# Patient Record
Sex: Female | Born: 2000 | Race: White | Hispanic: No | Marital: Married | Smoking: Former smoker
Health system: Southern US, Community
[De-identification: ages and names within clinical notes are randomized; demographics above are authoritative.]

## PROBLEM LIST (undated history)

## (undated) DIAGNOSIS — F419 Anxiety disorder, unspecified: Secondary | ICD-10-CM

## (undated) HISTORY — PX: WISDOM TOOTH EXTRACTION: SHX21

## (undated) NOTE — *Deleted (*Deleted)
NAMELaparis Lynn, MRN:  [id number], DOB:  06-09-2001, LOS: 1 ADMISSION DATE:  10/10/2020, CONSULTATION DATE:  10/07/20 REFERRING MD:  Karren Burly, MD CHIEF COMPLAINT:  Belly pain   Brief History   48 year old who presented to the ED with severe abdominal pain found to have acute idiopathic pancreatitis.  History of present illness   Patient had onset of belly pain about 1 to 2 days ago.  Is associated with nausea with vomiting.  She points to the top of her belly.  Does endorse some radiation to the back.  No shortness of breath.  Has developed a little bit of chest pain over the last few hours.  No fever or chills.  She drinks irregularly, had 3 white claws last week.  No significant binge drinking recently.  She does not do IV drugs.  Does smoke marijuana on occasion.  No obvious medication culprits for pancreatitis on her medication list.  In the ED, she has been intermittently tachycardic.  She is demonstrated saturations in the mid to high 90s on room air.  Her blood pressure has been okay.  Her hemoglobin and hematocrit have increased despite aggressive hydration.  Leukocytosis climbed 20,000-40,000.  Lipase 1300.  No hyperbilirubinemia.  LFTs mildly elevated.  Noted to have some fatty liver on imaging.  Triglycerides only 181.  She endorses good urine output with the immense amount of IV fluid she is receiving.  Pertinent family history of alcohol abuse and depression from mother.   Past Medical History  Anxiety PTSD Bipolar type 2 with depression  Significant Hospital Events   10/21 admit, change to ICU status, start pressors 10/22 Intubated, start ARDS protocol  Consults:  GI Nephrology  Procedures:  ETT 10/22 >> Lt IJ CVL 10/22 >> Arterial line 10/22 >>   Significant Diagnostic Tests:   CT abd/pelvis 10/21 >> trace effusions, small ascites, fatty liver, diffuse inflammatory changes of pancreas with peripancreatic fluid and edema  Abd u/s 10/21 >> no  gallstones, CBD 5 mm  Micro Data:  COVID 10/21 >> negative Flu 10/21 >> negative MRSA by PCR 10/21 >> negative HIV screen >> non reactive Blood 10/21 >> no growth  Antimicrobials:  Meropenem 10/21 >>   Interim history/subjective:   Pt on versed, fentanyl  Patient currently on CRRT 10/22 tmax 102/  WBC 32.6 Vent - 60%, PEEP 10, RR 30 Glucose range 224-292 I/O 20 UOP; 6,043.4 + in last 24 hours   Objective   Blood pressure (!) 87/46, pulse (!) 110, temperature 99.3 F (37.4 C), resp. rate (!) 28, height 5' 4.02" (1.626 m), weight 83.9 kg, last menstrual period 09/25/2020, SpO2 100 %.    Vent Mode: PRVC FiO2 (%):  [80 %-100 %] 80 % Set Rate:  [28 bmp] 28 bmp Vt Set:  [380 mL] 380 mL PEEP:  [10 cmH20] 10 cmH20 Plateau Pressure:  [25 cmH20-32 cmH20] 32 cmH20   Intake/Output Summary (Last 24 hours) at 10/07/2020 0819 Last data filed at 10/07/2020 0800 Gross per 24 hour  Intake 5336.68 ml  Output 467 ml  Net 4869.68 ml   Filed Weights   10/06/20 0040 10/07/20 0100  Weight: 83.9 kg 83.9 kg    Examination:  General: pale, adult female lying in bed on vent in NAD HEENT: MM pink/moist, pupils reactive, no scleral icterus Neuro: sedate, no follow commands, weak cough/gag, moved head some when suctioned CV: s1s2, tachy, no m/r/g appreciated PULM: non-labored on vent, lungs bilaterally clear/diminished GI: firm and distended, absent  BS Extremities: cool/dry, no edema, mild cyanosis of the nail bed and slow cap refill Skin: no ecchymosis      Resolved Hospital Problem list     Assessment & Plan:   Severe acute pancreatitis. - AST 2,382 and ALT 922: possible shock liver; consider pancreatitis possibly due to alcohol abuse -Idiopathic more likely since IgG4 came back normal autoimmune less likely -TG this morning 272: goal keep TG <500; no tx necessary at this time -Lipase increased to 5,525 -Leukocytosis-prophylactic Meropenem started 10/21; cultures negative - GI  consulted >> no indication for procedures at this time - trickle tube feeds with vital high protein  SIRS/sepsis with lactic acidosis from acute pancreatitis. - continue volume resuscitation - titrate levo and neo to keep MAP > 65 - f/u lactic acid - monitor CVP  Acute hypoxic respiratory failure in setting of pancreatitis. - patient vented: ARDS protocol >> start with 7 cc/kg for Vt  - ?Consider increasing RR from 28 to 35 to help compensate for met acidosis - goal SpO2 90 to 95% - f/u CXR, ABG as needed  AKI from ATN. Hyperkalemia. Anion gap metabolic acidosis. - baseline creatinine 0.75 from 10-21-2020 - nephrology consulted 10/06/2020 - will likely need to start CRRT  Anemia of critical illness. - f/u CBC  Hyperkalemia Hypocalcemia Hyperglycemia Hypoalbuminemia Hypomagnesemia -Calcium Gluconate started 10/22 -SSI -consider albumin replacement if level <2g/dL -Consider magnesium replacement if symptomatic -Consider Kayexalate -Recheck CMP later today  Sedation while on ventilator. Hx of anxiety. - versed, fentanyl gtt for RASS goal -2 to -3 - hold outpt klonopin, seroquel, prozac  Best practice:  Diet: trickle tube feeds with vital high protein DVT prophylaxis: heparin SUP: protonix Mobility: bed rest Family: called and updated husband Disposition: ICU  Labs    CMP Latest Ref Rng & Units 10/07/2020 10/07/2020 10/07/2020  Glucose 70 - 99 mg/dL - 454(U) 981(X)  BUN 6 - 20 mg/dL - 91(Y) 78(G)  Creatinine 0.44 - 1.00 mg/dL - 9.56(O) 1.30(Q)  Sodium 135 - 145 mmol/L - 135 140  Potassium 3.5 - 5.1 mmol/L 7.2(HH) 7.2(HH) 7.2(HH)  Chloride 98 - 111 mmol/L - 108 109  CO2 22 - 32 mmol/L - 16(L) 10(L)  Calcium 8.9 - 10.3 mg/dL - 5.8(LL) 6.1(LL)  Total Protein 6.5 - 8.1 g/dL - 5.0(L) -  Total Bilirubin 0.3 - 1.2 mg/dL - 1.3(H) -  Alkaline Phos 38 - 126 U/L - 29(L) -  AST 15 - 41 U/L - 2,382(H) -  ALT 0 - 44 U/L - 922(H) -    CBC Latest Ref Rng & Units  10/07/2020 10/07/2020 10/07/2020  WBC 4.0 - 10.5 K/uL 32.6(H) 40.5(H) -  Hemoglobin 12.0 - 15.0 g/dL 6.5(H) 10.1(L) 10.2(L)  Hematocrit 36 - 46 % 30.4(L) 34.7(L) 30.0(L)  Platelets 150 - 400 K/uL 198 296 -    ABG    Component Value Date/Time   PHART 7.159 (LL) 10/07/2020 0625   PCO2ART 46.2 10/07/2020 0625   PO2ART 124 (H) 10/07/2020 0625   HCO3 15.4 (L) 10/07/2020 0625   TCO2 14 (L) 10/07/2020 0148   ACIDBASEDEF 11.9 (H) 10/07/2020 0625   O2SAT 97.4 10/07/2020 0625    CBG (last 3)  Recent Labs    10/07/20 0504 10/07/20 0628 10/07/20 0744  GLUCAP 292* 226* 264*    Critical care time: 48 minutes independent of procedure time.    [facility] Pager - [phone] 10/07/2020, 8:19 AM

---

## 2020-03-11 ENCOUNTER — Ambulatory Visit: Payer: BC Managed Care – PPO | Attending: Internal Medicine

## 2020-03-11 DIAGNOSIS — Z23 Encounter for immunization: Secondary | ICD-10-CM

## 2020-03-11 NOTE — Progress Notes (Signed)
   Covid-19 Vaccination Clinic  Name:  Heather Lynn    MRN: [id number] DOB: 10-28-2001  03/11/2020  Heather Lynn was observed post Covid-19 immunization for 15 minutes without incident. She was provided with Vaccine Information Sheet and instruction to access the V-Safe system.   Heather Lynn was instructed to call 911 with any severe reactions post vaccine: Marland Kitchen Difficulty breathing  . Swelling of face and throat  . A fast heartbeat  . A bad rash all over body  . Dizziness and weakness   Immunizations Administered    Name Date Dose VIS Date Route   Pfizer COVID-19 Vaccine 03/11/2020  1:20 PM 0.3 mL 11/27/2019 Intramuscular   Manufacturer: [employer], [employer]   Lot: [id number]   NDC: [address]-2

## 2020-04-05 ENCOUNTER — Ambulatory Visit: Payer: BC Managed Care – PPO | Attending: Internal Medicine

## 2020-04-05 DIAGNOSIS — Z23 Encounter for immunization: Secondary | ICD-10-CM

## 2020-04-05 NOTE — Progress Notes (Signed)
   Covid-19 Vaccination Clinic  Name:  Heather Lynn    MRN: [id number] DOB: 07-25-2001  04/05/2020  [name] was observed post Covid-19 immunization for 15 minutes without incident. She was provided with Vaccine Information Sheet and instruction to access the V-Safe system.   [name] was instructed to call 911 with any severe reactions post vaccine: Marland Kitchen Difficulty breathing  . Swelling of face and throat  . A fast heartbeat  . A bad rash all over body  . Dizziness and weakness   Immunizations Administered    Name Date Dose VIS Date Route   Pfizer COVID-19 Vaccine 04/05/2020 12:09 PM 0.3 mL 02/10/2019 Intramuscular   Manufacturer: [employer], [employer]   Lot: [id number]   NDC: [address]-6

## 2020-05-09 ENCOUNTER — Other Ambulatory Visit: Payer: Self-pay

## 2020-05-09 ENCOUNTER — Ambulatory Visit (INDEPENDENT_AMBULATORY_CARE_PROVIDER_SITE_OTHER): Payer: BC Managed Care – PPO | Admitting: Psychiatry

## 2020-05-09 ENCOUNTER — Encounter: Payer: Self-pay | Admitting: Psychiatry

## 2020-05-09 VITALS — BP 141/96 | HR 108 | Ht 64.0 in | Wt 190.0 lb

## 2020-05-09 DIAGNOSIS — F3132 Bipolar disorder, current episode depressed, moderate: Secondary | ICD-10-CM | POA: Diagnosis not present

## 2020-05-09 DIAGNOSIS — F4310 Post-traumatic stress disorder, unspecified: Secondary | ICD-10-CM

## 2020-05-09 DIAGNOSIS — F515 Nightmare disorder: Secondary | ICD-10-CM | POA: Diagnosis not present

## 2020-05-09 MED ORDER — LURASIDONE HCL 40 MG PO TABS: 40.0000 mg | ORAL_TABLET | Freq: Every day | ORAL | 0 refills | Status: DC

## 2020-05-09 MED ORDER — LATUDA 20 MG PO TABS: 20.0000 mg | ORAL_TABLET | Freq: Every day | ORAL | 0 refills | Status: DC

## 2020-05-09 MED ORDER — DOXAZOSIN MESYLATE 4 MG PO TABS: ORAL_TABLET | ORAL | 1 refills | Status: DC

## 2020-05-09 MED ORDER — CLONAZEPAM 1 MG PO TABS: ORAL_TABLET | ORAL | 0 refills | Status: DC
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
## 2020-05-18 ENCOUNTER — Telehealth: Payer: Self-pay | Admitting: Psychiatry

## 2020-05-18 NOTE — Telephone Encounter (Signed)
Pt would like to talk about her manic episode that she thinks she is having. Pt having racing thoughts, can't focus and anger. Pt thinks she may need a med change.

## 2020-05-19 ENCOUNTER — Telehealth: Payer: Self-pay | Admitting: Psychiatry

## 2020-05-19 DIAGNOSIS — F4310 Post-traumatic stress disorder, unspecified: Secondary | ICD-10-CM

## 2020-05-19 MED ORDER — CLONAZEPAM 1 MG PO TABS: 1.0000 mg | ORAL_TABLET | Freq: Three times a day (TID) | ORAL | 0 refills | Status: DC | PRN

## 2020-05-19 NOTE — Telephone Encounter (Signed)
Noted left her a message yesterday to call back. Will try to reach her again. Calls have been from office so shouldn't be an issue.

## 2020-05-19 NOTE — Telephone Encounter (Signed)
Pt called is requesting a call back from [facility] or Nurse. She believes she is having a manic episode and her anxiety is very high. Pt has been calling all morning because she is really concerned and needs to speak with someone urgently. She was told that a call from the nurse or Shanda Bumps would come in as private being that they are working from home but tthe patients phone will not let private or blocked calls come through. This another thing she is upset about because she feels she wont get to speak with anyone.  [phone]

## 2020-05-19 NOTE — Telephone Encounter (Signed)
Pt spoke with nurse and reported:   She started having manic symptoms the day she came in for a visit. C/o unable to slow down, feels like she constantly has to be doing something. Could not sit down and read, unable to focus. Feels like her brain won't slow down. In the past when this has happened she gets impulsive and does stupid things and doesn't want that to happen again. She's very irritable for no reason. Trouble sleeping, taking "a lot" of Advil PM and Benadryl to be able to sleep. Only sleeping about 6 hours off and on waking up.   Called pt based on information provided to nurse to r/o if she is having akathisia vs. mania. She reports that manic s/s likely started the day she was seen or the day after. Has been working out compulsively. Having excessive energy. Having racing thoughts and cannot concentrate. She is noticing some impulsivity. She reports that she has disrupted sleep and having to take OTC meds to sleep. Has been fixated with wt loss and has lost 8 lbs in one week. More talkative and social. Has had increased anxiety.  She reports that she has been taking Klonopin prn about every other day. She reports that she notices some relief in s/s with klonopin. She reports that her mood is depressed and irritable. Denies SI.   Pressured speech.   Denies medication side effects. Reports frequent urination and is drinking more water than usual.   Past Psychiatric Medication Trials: Klonopin- took prn for panic.  Seroquel- Helpful for insomnia. Slept excessively, about 14 hours a night.  Trazodone- May have helped Prozac-Cannot recall response. Zoloft- Took in middle school. Lamictal- Adverse reaction, severe anger.    Plan: Increase Klonopin to 1 mg po TID x 5 days to improve acute mania/mixed episode s/s. Discussed that Klonopin would also be helpful if she is experiencing akathisia with Latuda in addition to acute mania. Continue Latuda 40 mg po qd. Requested that pt call office on  Monday with update re: manic s/s and if restlessness has resolved. Patient advised to contact office with any questions, adverse effects, or acute worsening in signs and symptoms.

## 2020-05-23 ENCOUNTER — Telehealth: Payer: Self-pay | Admitting: Psychiatry

## 2020-05-23 DIAGNOSIS — F3132 Bipolar disorder, current episode depressed, moderate: Secondary | ICD-10-CM

## 2020-05-23 MED ORDER — LURASIDONE HCL 40 MG PO TABS: 60.0000 mg | ORAL_TABLET | Freq: Every day | ORAL | 0 refills | Status: DC

## 2020-05-23 NOTE — Telephone Encounter (Signed)
Returned call to pt. She reports that she is not doing very well. She reports that she has not noticed any significant improvement over the last several days. She reports that her s/s are about the same where she is having to do things around the clock to prevent feeling depressed. She reports that she has been developing new interests and working on projects late at night. She reports that her mood is very sad, about a 2 or 3 with 0= most depressed imaginable. She reports that she was awake all night researching the phases of the moon and the zodiac. She reports that she has been having impulses to spend money. She reports that she has not been able to sleep is she does not take Klonopin. She would like to stop taking Klonopin consistently. Denies SI.   She asks about Prozac since she took it in the past.   Case staffed with Dr. Jennelle Human. Discussed increasing Latuda to 60 mg po qd with a meal to improve mood s/s. Will provide samples for pt to p/u.

## 2020-06-06 ENCOUNTER — Ambulatory Visit: Payer: BC Managed Care – PPO | Admitting: Psychiatry

## 2020-06-17 ENCOUNTER — Encounter: Payer: Self-pay | Admitting: Psychiatry

## 2020-06-17 ENCOUNTER — Other Ambulatory Visit: Payer: Self-pay

## 2020-06-17 ENCOUNTER — Ambulatory Visit (INDEPENDENT_AMBULATORY_CARE_PROVIDER_SITE_OTHER): Payer: BC Managed Care – PPO | Admitting: Psychiatry

## 2020-06-17 DIAGNOSIS — F3132 Bipolar disorder, current episode depressed, moderate: Secondary | ICD-10-CM | POA: Diagnosis not present

## 2020-06-17 DIAGNOSIS — F4310 Post-traumatic stress disorder, unspecified: Secondary | ICD-10-CM | POA: Diagnosis not present

## 2020-06-17 DIAGNOSIS — F515 Nightmare disorder: Secondary | ICD-10-CM

## 2020-06-17 DIAGNOSIS — G4700 Insomnia, unspecified: Secondary | ICD-10-CM | POA: Diagnosis not present

## 2020-06-17 DIAGNOSIS — F4312 Post-traumatic stress disorder, chronic: Secondary | ICD-10-CM

## 2020-06-17 MED ORDER — FLUOXETINE HCL 20 MG PO CAPS: 20.0000 mg | ORAL_CAPSULE | Freq: Every day | ORAL | 1 refills | Status: DC

## 2020-06-17 MED ORDER — QUETIAPINE FUMARATE 25 MG PO TABS: ORAL_TABLET | ORAL | 1 refills | Status: DC

## 2020-06-17 NOTE — Progress Notes (Signed)
Heather Lynn [id number] 11-14-2001 19 y.o.  Subjective:   Patient ID:  Heather Lynn is a 19 y.o. (DOB [birth year]) female.  Chief Complaint:  Chief Complaint  Patient presents with  . Depression  . Anxiety  . Insomnia  . Other    Mood lability    HPI Shauniece Lynn presents to the office today for follow-up of anxiety, mood distrurbance, and sleep disturbance. She reports that she has not taken her medication recently and stopped it after spending impulsively and making poor decisions. She reports that these s/s seemed to improve when she stopped Latuda and that mood and anxiety have returned to baseline. Denies any manic s/s in the last few weeks. Mood has been persistently down. Restlessness has resolved. Reports that Doxazosin was not effective for nightmares.  She reports that she did not sleep last night. She reports that she has had altered sleep wake cycle and has been keeping husband's schedule since he works nights. She reports that when she takes OTC sleep aid she will sleep 12 hours and has difficulty waking up. Has decreased sleep most of the time.   Has been having significant anxiety. Started to have panic attack recently after conflict with family member and took Klonopin prn. Has been using Klonopin prn for anxiety a couple of times a week. Occ skin picking and this has improved with getting acrylic nails. Has been having frequent obsessive thoughts and compulsions. Reports that she has been having nightmares and this triggers intrusive memories. Reports some rumination. Reports that Klonopin is not effective for insomnia. She reports low energy and motivation and has not left her house recently. She reports that she has been consistently over eating. She reports that her concentration has been poor. Denies SI.   She reports that she took Seroquel and Prozac in years past and thinks this combination may have been helpful other than excessive somnolence with Seroquel. "When I was  awake, I was happy and things were good."   Has started working through [employer].    Past Psychiatric Medication Trials: Klonopin- took prn for panic.  Seroquel- Helpful for insomnia. Slept excessively, about 14 hours a night.  Trazodone- May have helped Prozac-Cannot recall response. Zoloft- Took in middle school. Lamictal- Adverse reaction, severe anger.  Latuda- Worsening mood s/s.  Doxazosin- No improvement in nightmares  Review of Systems:  Review of Systems  Gastrointestinal: Negative.        Has chronically had nausea when anxious  Musculoskeletal: Negative for gait problem.  Neurological: Negative for tremors and headaches.  Psychiatric/Behavioral:       Please refer to HPI    Medications: I have reviewed the patient's current medications.  Current Outpatient Medications  Medication Sig Dispense Refill  . clonazePAM (KLONOPIN) 1 MG tablet Take 1 tablet (1 mg total) by mouth 3 (three) times daily as needed for anxiety. 60 tablet 0  . Ibuprofen-diphenhydrAMINE Cit (ADVIL PM PO) Take by mouth.    . montelukast (SINGULAIR) 10 MG tablet Take 10 mg by mouth daily.    Heather Lynn 0.18/0.215/0.25 MG-35 MCG tablet Take 1 tablet by mouth daily.    . diphenhydrAMINE (BENADRYL) 25 MG tablet Take 25 mg by mouth at bedtime as needed for sleep. (Patient not taking: Reported on 06/17/2020)    . FLUoxetine (PROZAC) 20 MG capsule Take 1 capsule (20 mg total) by mouth daily. 30 capsule 1  . levocetirizine (XYZAL) 5 MG tablet Take 5 mg by mouth at bedtime. (Patient not taking: Reported  on 06/17/2020)    . QUEtiapine (SEROQUEL) 25 MG tablet Take 1-2 tabs po QHS 60 tablet 1   No current facility-administered medications for this visit.    Medication Side Effects: Other: Worsening mood signs and symptoms with Latuda  Allergies:  Allergies  Allergen Reactions  . Lamictal [Lamotrigine]     Severe anger    History reviewed. No pertinent past medical history.  Family History   Problem Relation Age of Onset  . Alcohol abuse Mother   . Depression Mother   . Depression Half-Brother   . Alcohol abuse Half-Brother   . Post-traumatic stress disorder Half-Sister     Social History   Socioeconomic History  . Marital status: Married    Spouse name: Not on file  . Number of children: Not on file  . Years of education: Not on file  . Highest education level: Not on file  Occupational History  . Not on file  Tobacco Use  . Smoking status: Former Games developer  . Smokeless tobacco: Never Used  Substance and Sexual Activity  . Alcohol use: Not Currently  . Drug use: Not Currently  . Sexual activity: Not on file  Other Topics Concern  . Not on file  Social History Narrative  . Not on file   Social Determinants of Health   Financial Resource Strain:   . Difficulty of Paying Living Expenses:   Food Insecurity:   . Worried About Programme researcher, broadcasting/film/video in the Last Year:   . Barista in the Last Year:   Transportation Needs:   . Freight forwarder (Medical):   Marland Kitchen Lack of Transportation (Non-Medical):   Physical Activity:   . Days of Exercise per Week:   . Minutes of Exercise per Session:   Stress:   . Feeling of Stress :   Social Connections:   . Frequency of Communication with Friends and Family:   . Frequency of Social Gatherings with Friends and Family:   . Attends Religious Services:   . Active Member of Clubs or Organizations:   . Attends Banker Meetings:   Marland Kitchen Marital Status:   Intimate Partner Violence:   . Fear of Current or Ex-Partner:   . Emotionally Abused:   Marland Kitchen Physically Abused:   . Sexually Abused:     Past Medical History, Surgical history, Social history, and Family history were reviewed and updated as appropriate.   Please see review of systems for further details on the patient's review from today.   Objective:   Physical Exam:  There were no vitals taken for this visit.  Physical Exam Constitutional:       General: She is not in acute distress. Musculoskeletal:        General: No deformity.  Neurological:     Mental Status: She is alert and oriented to person, place, and time.     Coordination: Coordination normal.  Psychiatric:        Attention and Perception: Attention and perception normal. She does not perceive auditory or visual hallucinations.        Mood and Affect: Mood is anxious and depressed. Affect is not labile, blunt, angry or inappropriate.        Speech: Speech normal.        Behavior: Behavior normal.        Thought Content: Thought content normal. Thought content is not paranoid or delusional. Thought content does not include homicidal or suicidal ideation. Thought content does not include  homicidal or suicidal plan.        Cognition and Memory: Cognition and memory normal.        Judgment: Judgment normal.     Comments: Insight intact     Lab Review:  No results found for: NA, K, CL, CO2, GLUCOSE, BUN, CREATININE, CALCIUM, PROT, ALBUMIN, AST, ALT, ALKPHOS, BILITOT, GFRNONAA, GFRAA  No results found for: WBC, RBC, HGB, HCT, PLT, MCV, MCH, MCHC, RDW, LYMPHSABS, MONOABS, EOSABS, BASOSABS  No results found for: POCLITH, LITHIUM   No results found for: PHENYTOIN, PHENOBARB, VALPROATE, CBMZ   .res Assessment: Plan:   Patient seen for 30 minutes and time spent discussing possible treatment options with patient.  She reports that in the past her mood and anxiety signs and symptoms were best controlled with Prozac and Seroquel, however she recalls having excessive somnolence with Seroquel. Discussed potential benefits, risks, and side effects of Saphris and she reports that she would like to avoid oral disintegrating tablets.  Patient reports that she is amenable to restarting Seroquel and attempting to adjust dose if excessive somnolence occurs. Will start Prozac 20 mg daily for mood and anxiety. Will start Seroquel 25 mg 1 to 2 tablets p.o. nightly for mood stabilization and  insomnia. Patient to follow-up in 4 weeks or sooner if clinically indicated. Patient advised to contact office with any questions, adverse effects, or acute worsening in signs and symptoms.  Lilymae was seen today for depression, anxiety, insomnia and other.  Diagnoses and all orders for this visit:  Bipolar affective disorder, currently depressed, moderate (HCC) -     FLUoxetine (PROZAC) 20 MG capsule; Take 1 capsule (20 mg total) by mouth daily. -     QUEtiapine (SEROQUEL) 25 MG tablet; Take 1-2 tabs po QHS  PTSD (post-traumatic stress disorder) -     FLUoxetine (PROZAC) 20 MG capsule; Take 1 capsule (20 mg total) by mouth daily. -     QUEtiapine (SEROQUEL) 25 MG tablet; Take 1-2 tabs po QHS     Please see After Visit Summary for patient specific instructions.  Future Appointments  Date Time Provider Department Center  07/15/2020 11:30 AM Corie Chiquito, PMHNP [facility] None    No orders of the defined types were placed in this encounter.   -------------------------------

## 2020-07-04 ENCOUNTER — Other Ambulatory Visit: Payer: Self-pay | Admitting: Psychiatry

## 2020-07-04 DIAGNOSIS — F4312 Post-traumatic stress disorder, chronic: Secondary | ICD-10-CM

## 2020-07-15 ENCOUNTER — Ambulatory Visit (INDEPENDENT_AMBULATORY_CARE_PROVIDER_SITE_OTHER): Payer: BC Managed Care – PPO | Admitting: Psychiatry

## 2020-07-15 ENCOUNTER — Encounter: Payer: Self-pay | Admitting: Psychiatry

## 2020-07-15 ENCOUNTER — Other Ambulatory Visit: Payer: Self-pay

## 2020-07-15 DIAGNOSIS — F515 Nightmare disorder: Secondary | ICD-10-CM

## 2020-07-15 DIAGNOSIS — F4310 Post-traumatic stress disorder, unspecified: Secondary | ICD-10-CM

## 2020-07-15 DIAGNOSIS — F3132 Bipolar disorder, current episode depressed, moderate: Secondary | ICD-10-CM

## 2020-07-15 DIAGNOSIS — G4700 Insomnia, unspecified: Secondary | ICD-10-CM

## 2020-07-15 MED ORDER — QUETIAPINE FUMARATE 100 MG PO TABS: ORAL_TABLET | ORAL | 0 refills | Status: DC

## 2020-07-15 MED ORDER — CLONAZEPAM 1 MG PO TABS: 1.0000 mg | ORAL_TABLET | Freq: Every day | ORAL | 1 refills | Status: DC | PRN

## 2020-07-15 NOTE — Progress Notes (Signed)
Heather Lynn [id number] 07-08-2001 19 y.o.  Subjective:   Patient ID:  Heather Lynn is a 19 y.o. (DOB 2001-10-31) female.  Chief Complaint:  Chief Complaint  Patient presents with   Anxiety   Depression    HPI Heather Lynn presents to the office today for follow-up of anxiety, mood disturbance, and insomnia. She denies any significant changes with medication. She reports some improvement in mood and that she has had less mood lability. She reports that she continues to have persistent depression and that depression is somewhat improved. Denies irritability.  She reports that her anxiety has not improved and may be increased due to circumstances. Has signed up to take classes this fall and has anxiety in anticipation of returning to school. Sometimes feels shaky upon awakening. Has started skin and nail picking again. She reports that she has had about 3-4 panic attacks since last visit. Will try to take Klonopin when she notices early signs of panic. She reports that obsessions and compulsions have increased.Reports that she cannot recall some details from past traumatic events. She reports some episodes of dissociation and depersonalization. Denies any impulsive or risky behavior. Denies excessive spending. Denies elevated mood. Has been sleeping 7-8 hours. She reports excessive somnolence initially and this improved. Energy and motivation have been low. Concentration is ok and able to focus on watching movies. Appetite has been "mostly normal." Denies SI.   Reports stressor involving her sister has resolved. Seeing therapist weekly. Continues to work through [employer].   Taking Klonopin prn a few times a week.   Past Psychiatric Medication Trials: Klonopin- took prn for panic.  Seroquel- Helpful for insomnia. Slept excessively, about 14 hours a night.  Trazodone- May have helped Prozac-Cannot recall response. Zoloft- Took in middle school. Lamictal- Adverse reaction, severe  anger. Latuda- Worsening mood s/s.  Doxazosin- No improvement in nightmares AIMS     Office Visit from 07/15/2020 in [facility]  AIMS Total Score 0       Review of Systems:  Review of Systems  Gastrointestinal:       Some GI upset with anxiety.   Genitourinary:       Increased urination  Musculoskeletal: Negative for gait problem.  Neurological: Negative for tremors.  Psychiatric/Behavioral:       Please refer to HPI    Medications: I have reviewed the patient's current medications.  Current Outpatient Medications  Medication Sig Dispense Refill   clonazePAM (KLONOPIN) 1 MG tablet Take 1 tablet (1 mg total) by mouth daily as needed for anxiety. 30 tablet 1   diphenhydrAMINE (BENADRYL) 25 MG tablet Take 25 mg by mouth at bedtime as needed for sleep. (Patient not taking: Reported on 06/17/2020)     FLUoxetine (PROZAC) 20 MG capsule Take 1 capsule (20 mg total) by mouth daily. 30 capsule 1   Ibuprofen-diphenhydrAMINE Cit (ADVIL PM PO) Take by mouth.     levocetirizine (XYZAL) 5 MG tablet Take 5 mg by mouth at bedtime. (Patient not taking: Reported on 06/17/2020)     montelukast (SINGULAIR) 10 MG tablet Take 10 mg by mouth daily.     QUEtiapine (SEROQUEL) 100 MG tablet Take 1 tab po QHS x 2 nights, then 2 tabs po QHS x 2 nights, then increase to 3 tabs po QHS 90 tablet 0   TRI-ESTARYLLA 0.18/0.215/0.25 MG-35 MCG tablet Take 1 tablet by mouth daily.     No current facility-administered medications for this visit.    Medication Side Effects: None  Allergies:  Allergies  Allergen Reactions   Lamictal [Lamotrigine]     Severe anger    History reviewed. No pertinent past medical history.  Family History  Problem Relation Age of Onset   Alcohol abuse Mother    Depression Mother    Depression Half-Brother    Alcohol abuse Half-Brother    Post-traumatic stress disorder Half-Sister     Social History   Socioeconomic History   Marital status:  Married    Spouse name: Not on file   Number of children: Not on file   Years of education: Not on file   Highest education level: Not on file  Occupational History   Not on file  Tobacco Use   Smoking status: Former Smoker   Smokeless tobacco: Never Used  Substance and Sexual Activity   Alcohol use: Not Currently   Drug use: Not Currently   Sexual activity: Not on file  Other Topics Concern   Not on file  Social History Narrative   Not on file   Social Determinants of Health   Financial Resource Strain:    Difficulty of Paying Living Expenses:   Food Insecurity:    Worried About Programme researcher, broadcasting/film/video in the Last Year:    Barista in the Last Year:   Transportation Needs:    Freight forwarder (Medical):    Lack of Transportation (Non-Medical):   Physical Activity:    Days of Exercise per Week:    Minutes of Exercise per Session:   Stress:    Feeling of Stress :   Social Connections:    Frequency of Communication with Friends and Family:    Frequency of Social Gatherings with Friends and Family:    Attends Religious Services:    Active Member of Clubs or Organizations:    Attends Engineer, structural:    Marital Status:   Intimate Partner Violence:    Fear of Current or Ex-Partner:    Emotionally Abused:    Physically Abused:    Sexually Abused:     Past Medical History, Surgical history, Social history, and Family history were reviewed and updated as appropriate.   Please see review of systems for further details on the patient's review from today.   Objective:   Physical Exam:  There were no vitals taken for this visit.  Physical Exam Constitutional:      General: She is not in acute distress. Musculoskeletal:        General: No deformity.  Neurological:     Mental Status: She is alert and oriented to person, place, and time.     Coordination: Coordination normal.  Psychiatric:        Attention and  Perception: Attention and perception normal. She does not perceive auditory or visual hallucinations.        Mood and Affect: Mood normal. Mood is not anxious or depressed. Affect is not labile, blunt, angry or inappropriate.        Speech: Speech normal.        Behavior: Behavior normal.        Thought Content: Thought content normal. Thought content is not paranoid or delusional. Thought content does not include homicidal or suicidal ideation. Thought content does not include homicidal or suicidal plan.        Cognition and Memory: Cognition and memory normal.        Judgment: Judgment normal.     Comments: Insight intact     Lab Review:  No results found for: NA, K, CL, CO2, GLUCOSE, BUN, CREATININE, CALCIUM, PROT, ALBUMIN, AST, ALT, ALKPHOS, BILITOT, GFRNONAA, GFRAA  No results found for: WBC, RBC, HGB, HCT, PLT, MCV, MCH, MCHC, RDW, LYMPHSABS, MONOABS, EOSABS, BASOSABS  No results found for: POCLITH, LITHIUM   No results found for: PHENYTOIN, PHENOBARB, VALPROATE, CBMZ   .res Assessment: Plan:   Patient seen for 30 minutes and time spent discussing potential benefits, risks, and side effects of increasing Seroquel.  Discussed that 300 mg of Seroquel is typically needed to adequately control mood signs and symptoms.  Discussed that she may experience increased somnolence initially with titration of Seroquel and then excessive somnolence should improve.  Discussed that higher dose of Seroquel may also improve anxiety signs and symptoms.  Discussed continuing Prozac 20 mg daily at this time for anxiety and depression and may consider future increase if needed once Seroquel is at a therapeutic dose for mood stabilization to prevent precipitation of manic signs and symptoms. Discussed different triggers for worsening mood signs and symptoms, to include seasonal pattern and worsening mood in response to anniversary of traumatic events.  Patient reports that she plans to explore this further  with therapist after identifying that her mood typically worsens in February mood is the anniversary of her traumatic event. Will continue Klonopin prn anxiety. Pt to f/u in 4 weeks or sooner if clinically indicated.  Patient advised to contact office with any questions, adverse effects, or acute worsening in signs and symptoms.  Chrystine was seen today for anxiety and depression.  Diagnoses and all orders for this visit:  PTSD (post-traumatic stress disorder) -     QUEtiapine (SEROQUEL) 100 MG tablet; Take 1 tab po QHS x 2 nights, then 2 tabs po QHS x 2 nights, then increase to 3 tabs po QHS -     clonazePAM (KLONOPIN) 1 MG tablet; Take 1 tablet (1 mg total) by mouth daily as needed for anxiety.  Bipolar affective disorder, currently depressed, moderate (HCC) -     QUEtiapine (SEROQUEL) 100 MG tablet; Take 1 tab po QHS x 2 nights, then 2 tabs po QHS x 2 nights, then increase to 3 tabs po QHS  Insomnia, unspecified type  Nightmares associated with chronic post-traumatic stress disorder     Please see After Visit Summary for patient specific instructions.  Future Appointments  Date Time Provider Department Center  08/12/2020 10:00 AM Corie Chiquito, PMHNP [facility] None    No orders of the defined types were placed in this encounter.   -------------------------------

## 2020-07-15 NOTE — Progress Notes (Signed)
   07/15/20 1145  Facial and Oral Movements  Muscles of Facial Expression 0  Lips and Perioral Area 0  Jaw 0  Tongue 0  Extremity Movements  Upper (arms, wrists, hands, fingers) 0  Lower (legs, knees, ankles, toes) 0  Trunk Movements  Neck, shoulders, hips 0  Overall Severity  Severity of abnormal movements (highest score from questions above) 0  Incapacitation due to abnormal movements 0  Patient's awareness of abnormal movements (rate only patient's report) 0  AIMS Total Score  AIMS Total Score 0

## 2020-08-07 ENCOUNTER — Other Ambulatory Visit: Payer: Self-pay | Admitting: Psychiatry

## 2020-08-07 DIAGNOSIS — F3132 Bipolar disorder, current episode depressed, moderate: Secondary | ICD-10-CM

## 2020-08-07 DIAGNOSIS — F4310 Post-traumatic stress disorder, unspecified: Secondary | ICD-10-CM

## 2020-08-08 ENCOUNTER — Other Ambulatory Visit: Payer: Self-pay | Admitting: Psychiatry

## 2020-08-08 DIAGNOSIS — F3132 Bipolar disorder, current episode depressed, moderate: Secondary | ICD-10-CM

## 2020-08-08 DIAGNOSIS — F4310 Post-traumatic stress disorder, unspecified: Secondary | ICD-10-CM

## 2020-08-12 ENCOUNTER — Other Ambulatory Visit: Payer: Self-pay

## 2020-08-12 ENCOUNTER — Encounter: Payer: Self-pay | Admitting: Psychiatry

## 2020-08-12 ENCOUNTER — Ambulatory Visit (INDEPENDENT_AMBULATORY_CARE_PROVIDER_SITE_OTHER): Payer: BC Managed Care – PPO | Admitting: Psychiatry

## 2020-08-12 DIAGNOSIS — F4310 Post-traumatic stress disorder, unspecified: Secondary | ICD-10-CM

## 2020-08-12 DIAGNOSIS — G4700 Insomnia, unspecified: Secondary | ICD-10-CM | POA: Diagnosis not present

## 2020-08-12 DIAGNOSIS — F3132 Bipolar disorder, current episode depressed, moderate: Secondary | ICD-10-CM | POA: Diagnosis not present

## 2020-08-12 MED ORDER — QUETIAPINE FUMARATE 300 MG PO TABS: 300.0000 mg | ORAL_TABLET | Freq: Every day | ORAL | 1 refills | Status: DC

## 2020-08-12 MED ORDER — FLUOXETINE HCL 40 MG PO CAPS: 40.0000 mg | ORAL_CAPSULE | Freq: Every day | ORAL | 1 refills | Status: DC

## 2020-08-12 NOTE — Patient Instructions (Signed)
NAC (N-Acetyl-Cysteine) 600 mg twice daily
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
## 2020-08-31 ENCOUNTER — Other Ambulatory Visit: Payer: Self-pay | Admitting: Psychiatry

## 2020-08-31 DIAGNOSIS — F3132 Bipolar disorder, current episode depressed, moderate: Secondary | ICD-10-CM

## 2020-08-31 DIAGNOSIS — F4310 Post-traumatic stress disorder, unspecified: Secondary | ICD-10-CM

## 2020-09-09 ENCOUNTER — Ambulatory Visit: Payer: BC Managed Care – PPO | Admitting: Psychiatry

## 2020-09-22 ENCOUNTER — Ambulatory Visit: Payer: BC Managed Care – PPO | Admitting: Psychiatry

## 2020-09-28 ENCOUNTER — Encounter: Payer: Self-pay | Admitting: Psychiatry

## 2020-09-28 ENCOUNTER — Telehealth: Payer: Self-pay | Admitting: Psychiatry

## 2020-09-28 ENCOUNTER — Telehealth (INDEPENDENT_AMBULATORY_CARE_PROVIDER_SITE_OTHER): Payer: BC Managed Care – PPO | Admitting: Psychiatry

## 2020-09-28 DIAGNOSIS — F3132 Bipolar disorder, current episode depressed, moderate: Secondary | ICD-10-CM

## 2020-09-28 DIAGNOSIS — G4700 Insomnia, unspecified: Secondary | ICD-10-CM

## 2020-09-28 DIAGNOSIS — F4310 Post-traumatic stress disorder, unspecified: Secondary | ICD-10-CM | POA: Diagnosis not present

## 2020-09-28 MED ORDER — CLONAZEPAM 1 MG PO TABS: 1.0000 mg | ORAL_TABLET | Freq: Every day | ORAL | 1 refills | Status: AC | PRN

## 2020-09-28 MED ORDER — QUETIAPINE FUMARATE 400 MG PO TABS
400.0000 mg | ORAL_TABLET | Freq: Every day | ORAL | 1 refills | Status: AC
Start: 2020-09-28 — End: 2020-10-28

## 2020-09-28 MED ORDER — FLUOXETINE HCL 20 MG PO CAPS
20.0000 mg | ORAL_CAPSULE | Freq: Every day | ORAL | 1 refills | Status: AC
Start: 2020-09-28 — End: ?

## 2020-09-28 NOTE — Progress Notes (Signed)
Heather Lynn [id number] 01/18/2001 19 y.o.  Virtual Visit via Video Note  I connected with pt @ on 09/28/20 at 10:00 AM EDT by a video enabled telemedicine application and verified that I am speaking with the correct person using two identifiers.   I discussed the limitations of evaluation and management by telemedicine and the availability of in person appointments. The patient expressed understanding and agreed to proceed.  I discussed the assessment and treatment plan with the patient. The patient was provided an opportunity to ask questions and all were answered. The patient agreed with the plan and demonstrated an understanding of the instructions.   The patient was advised to call back or seek an in-person evaluation if the symptoms worsen or if the condition fails to improve as anticipated.  I provided 30 minutes of non-face-to-face time during this encounter.  The patient was located at home.  The provider was located at [facility].   Corie Chiquito, PMHNP   Subjective:   Patient ID:  Heather Lynn is a 19 y.o. (DOB Nov 24, 2001) female.  Chief Complaint:  Chief Complaint  Patient presents with  . Anxiety  . Depression  . Sleeping Problem    HPI Heather Lynn presents for follow-up of sleep disturbance, anxiety, and mood disturbance. She reports that she has had disrupted sleep schedule that worsened over the last week. She reports that she missed her last apt due to altered sleep schedule. She reports "today has been he first time I have slept in a couple of days. She reports that her nightmares have been worse. She reports that her anxiety has been "through the roof." Has had a few panic attacks, typically when she leaves home. Having anxiety with leaving the house and is staying in most of the time. Has been having intrusive memories and flashbacks. She suspects that she has been dissociation because she is losing time and cannot remember what happened. She reports  worsening intrusive thoughts and worry. She reports that mood has been "very sad." Has had irritability with decreased sleep. Denies any elevated moods or impulsive or risky behaviors. Has racing thoughts with increased anxiety. Appetite has been decreased and eating one meal a day and is not thinking about eating. Energy and motivation have been low.   She reports poor concentration and focus. Has been unable to sit and read a book. She reports that she has not fallen behind in her school work since she had completed most assignments in advance. She is now completing assignments at the last minute. Denies SI. She reports that she had recent relapse in self-harm and has not had any SIB since then.   Reports that she has been needing Klonopin prn almost daily. Reports that she has missed multiple days of Prozac  Has started seeing a trauma specialist and is starting trauma work.   Past Psychiatric Medication Trials: Klonopin- took prn for panic.  Seroquel- Helpful for insomnia. Slept excessively, about 14 hours a night.  Trazodone- May have helped Prozac-Cannot recall response. Zoloft- Took in middle school. Lamictal- Adverse reaction, severe anger. Latuda- Worsening mood s/s.  Doxazosin- No improvement in nightmares  Review of Systems:  Review of Systems  Genitourinary: Positive for menstrual problem.  Musculoskeletal: Negative for gait problem.  Neurological: Negative for tremors.  Psychiatric/Behavioral:       Please refer to HPI    Medications: I have reviewed the patient's current medications.  Current Outpatient Medications  Medication Sig Dispense Refill  . TRI-ESTARYLLA 0.18/0.215/0.25 MG-35 MCG  tablet Take 1 tablet by mouth daily.    . Acetaminophen (MIDOL PO) Take by mouth.    Melene Muller ON 10/09/2020] clonazePAM (KLONOPIN) 1 MG tablet Take 1 tablet (1 mg total) by mouth daily as needed for anxiety. 30 tablet 1  . diphenhydrAMINE (BENADRYL) 25 MG tablet Take 25 mg by mouth  at bedtime as needed for sleep. (Patient not taking: Reported on 06/17/2020)    . FLUoxetine (PROZAC) 20 MG capsule Take 1 capsule (20 mg total) by mouth daily. 30 capsule 1  . Ibuprofen-diphenhydrAMINE Cit (ADVIL PM PO) Take by mouth daily as needed.  (Patient not taking: Reported on 09/28/2020)    . levocetirizine (XYZAL) 5 MG tablet Take 5 mg by mouth at bedtime. (Patient not taking: Reported on 06/17/2020)    . montelukast (SINGULAIR) 10 MG tablet Take 10 mg by mouth daily. (Patient not taking: Reported on 09/28/2020)    . QUEtiapine (SEROQUEL) 400 MG tablet Take 1 tablet (400 mg total) by mouth at bedtime. 30 tablet 1   No current facility-administered medications for this visit.    Medication Side Effects: Sleep Problems  Allergies:  Allergies  Allergen Reactions  . Lamictal [Lamotrigine]     Severe anger    History reviewed. No pertinent past medical history.  Family History  Problem Relation Age of Onset  . Alcohol abuse Mother   . Depression Mother   . Depression Half-Brother   . Alcohol abuse Half-Brother   . Post-traumatic stress disorder Half-Sister     Social History   Socioeconomic History  . Marital status: Married    Spouse name: Not on file  . Number of children: Not on file  . Years of education: Not on file  . Highest education level: Not on file  Occupational History  . Not on file  Tobacco Use  . Smoking status: Former Games developer  . Smokeless tobacco: Never Used  Substance and Sexual Activity  . Alcohol use: Not Currently  . Drug use: Not Currently  . Sexual activity: Not on file  Other Topics Concern  . Not on file  Social History Narrative  . Not on file   Social Determinants of Health   Financial Resource Strain:   . Difficulty of Paying Living Expenses: Not on file  Food Insecurity:   . Worried About Programme researcher, broadcasting/film/video in the Last Year: Not on file  . Ran Out of Food in the Last Year: Not on file  Transportation Needs:   . Lack of  Transportation (Medical): Not on file  . Lack of Transportation (Non-Medical): Not on file  Physical Activity:   . Days of Exercise per Week: Not on file  . Minutes of Exercise per Session: Not on file  Stress:   . Feeling of Stress : Not on file  Social Connections:   . Frequency of Communication with Friends and Family: Not on file  . Frequency of Social Gatherings with Friends and Family: Not on file  . Attends Religious Services: Not on file  . Active Member of Clubs or Organizations: Not on file  . Attends Banker Meetings: Not on file  . Marital Status: Not on file  Intimate Partner Violence:   . Fear of Current or Ex-Partner: Not on file  . Emotionally Abused: Not on file  . Physically Abused: Not on file  . Sexually Abused: Not on file    Past Medical History, Surgical history, Social history, and Family history were reviewed and updated  as appropriate.   Please see review of systems for further details on the patient's review from today.   Objective:   Physical Exam:  There were no vitals taken for this visit.  Physical Exam Constitutional:      General: She is not in acute distress. Musculoskeletal:        General: No deformity.  Neurological:     Mental Status: She is alert and oriented to person, place, and time.     Coordination: Coordination normal.  Psychiatric:        Attention and Perception: Attention and perception normal. She does not perceive auditory or visual hallucinations.        Mood and Affect: Mood is anxious and depressed. Affect is not labile, blunt, angry or inappropriate.        Speech: Speech normal.        Behavior: Behavior normal.        Thought Content: Thought content normal. Thought content is not paranoid or delusional. Thought content does not include homicidal or suicidal ideation. Thought content does not include homicidal or suicidal plan.        Cognition and Memory: Cognition and memory normal.        Judgment:  Judgment normal.     Comments: Insight intact     Lab Review:  No results found for: NA, K, CL, CO2, GLUCOSE, BUN, CREATININE, CALCIUM, PROT, ALBUMIN, AST, ALT, ALKPHOS, BILITOT, GFRNONAA, GFRAA  No results found for: WBC, RBC, HGB, HCT, PLT, MCV, MCH, MCHC, RDW, LYMPHSABS, MONOABS, EOSABS, BASOSABS  No results found for: POCLITH, LITHIUM   No results found for: PHENYTOIN, PHENOBARB, VALPROATE, CBMZ   .res Assessment: Plan:   Discussed that higher dose of Prozac seems to be causing worsening mood signs and symptoms and insomnia.  Recommend decreasing Prozac to 20 mg daily since patient had less insomnia and mood lability with lower dose of Prozac. Discussed potential benefits, risks, and side effects of increasing Seroquel to 400 mg at bedtime mood, anxiety and.  Patient agrees to increase in Seroquel. We will continue Klonopin 1 mg daily as needed for anxiety. Recommend continuing psychotherapy. Patient to follow-up in 4 to 6 weeks or sooner if clinically indicated. Patient advised to contact office with any questions, adverse effects, or acute worsening in signs and symptoms.  Heather Lynn was seen today for anxiety, depression and sleeping problem.  Diagnoses and all orders for this visit:  PTSD (post-traumatic stress disorder) -     QUEtiapine (SEROQUEL) 400 MG tablet; Take 1 tablet (400 mg total) by mouth at bedtime. -     FLUoxetine (PROZAC) 20 MG capsule; Take 1 capsule (20 mg total) by mouth daily. -     clonazePAM (KLONOPIN) 1 MG tablet; Take 1 tablet (1 mg total) by mouth daily as needed for anxiety.  Bipolar affective disorder, currently depressed, moderate (HCC) -     QUEtiapine (SEROQUEL) 400 MG tablet; Take 1 tablet (400 mg total) by mouth at bedtime. -     FLUoxetine (PROZAC) 20 MG capsule; Take 1 capsule (20 mg total) by mouth daily.  Insomnia, unspecified type     Please see After Visit Summary for patient specific instructions.  No future appointments.  No  orders of the defined types were placed in this encounter.     -------------------------------

## 2020-09-28 NOTE — Telephone Encounter (Signed)
Ms. afra, tricarico are scheduled for a virtual visit with your provider today.    Just as we do with appointments in the office, we must obtain your consent to participate.  Your consent will be active for this visit and any virtual visit you may have with one of our providers in the next 365 days.    If you have a MyChart account, I can also send a copy of this consent to you electronically.  All virtual visits are billed to your insurance company just like a traditional visit in the office.  As this is a virtual visit, video technology does not allow for your provider to perform a traditional examination.  This may limit your provider's ability to fully assess your condition.  If your provider identifies any concerns that need to be evaluated in person or the need to arrange testing such as labs, EKG, etc, we will make arrangements to do so.    Although advances in technology are sophisticated, we cannot ensure that it will always work on either your end or our end.  If the connection with a video visit is poor, we may have to switch to a telephone visit.  With either a video or telephone visit, we are not always able to ensure that we have a secure connection.   I need to obtain your verbal consent now.   Are you willing to proceed with your visit today?   Brendaliz Fecher has provided verbal consent on 09/28/2020 for a virtual visit (video or telephone).   Corie Chiquito, PMHNP 09/28/2020  10:03 AM

## 2020-10-04 ENCOUNTER — Other Ambulatory Visit: Payer: Self-pay | Admitting: Psychiatry

## 2020-10-04 DIAGNOSIS — F4310 Post-traumatic stress disorder, unspecified: Secondary | ICD-10-CM

## 2020-10-04 DIAGNOSIS — F3132 Bipolar disorder, current episode depressed, moderate: Secondary | ICD-10-CM

## 2020-10-05 ENCOUNTER — Encounter (HOSPITAL_COMMUNITY): Payer: Self-pay

## 2020-10-05 ENCOUNTER — Other Ambulatory Visit: Payer: Self-pay

## 2020-10-05 ENCOUNTER — Emergency Department (HOSPITAL_COMMUNITY): Payer: BC Managed Care – PPO

## 2020-10-05 ENCOUNTER — Inpatient Hospital Stay (HOSPITAL_COMMUNITY)
Admission: EM | Admit: 2020-10-05 | Discharge: 2020-10-17 | DRG: 853 | Disposition: E | Payer: BC Managed Care – PPO | Attending: Pulmonary Disease | Admitting: Pulmonary Disease

## 2020-10-05 DIAGNOSIS — E875 Hyperkalemia: Secondary | ICD-10-CM | POA: Diagnosis present

## 2020-10-05 DIAGNOSIS — K859 Acute pancreatitis without necrosis or infection, unspecified: Secondary | ICD-10-CM | POA: Diagnosis not present

## 2020-10-05 DIAGNOSIS — D6489 Other specified anemias: Secondary | ICD-10-CM | POA: Diagnosis present

## 2020-10-05 DIAGNOSIS — Z4659 Encounter for fitting and adjustment of other gastrointestinal appliance and device: Secondary | ICD-10-CM

## 2020-10-05 DIAGNOSIS — J9601 Acute respiratory failure with hypoxia: Secondary | ICD-10-CM | POA: Diagnosis not present

## 2020-10-05 DIAGNOSIS — R140 Abdominal distension (gaseous): Secondary | ICD-10-CM | POA: Diagnosis not present

## 2020-10-05 DIAGNOSIS — I10 Essential (primary) hypertension: Secondary | ICD-10-CM | POA: Diagnosis present

## 2020-10-05 DIAGNOSIS — R34 Anuria and oliguria: Secondary | ICD-10-CM | POA: Diagnosis present

## 2020-10-05 DIAGNOSIS — Z87891 Personal history of nicotine dependence: Secondary | ICD-10-CM

## 2020-10-05 DIAGNOSIS — Z6831 Body mass index (BMI) 31.0-31.9, adult: Secondary | ICD-10-CM

## 2020-10-05 DIAGNOSIS — R001 Bradycardia, unspecified: Secondary | ICD-10-CM | POA: Diagnosis not present

## 2020-10-05 DIAGNOSIS — R188 Other ascites: Secondary | ICD-10-CM | POA: Diagnosis present

## 2020-10-05 DIAGNOSIS — E8770 Fluid overload, unspecified: Secondary | ICD-10-CM | POA: Diagnosis not present

## 2020-10-05 DIAGNOSIS — E669 Obesity, unspecified: Secondary | ICD-10-CM | POA: Diagnosis present

## 2020-10-05 DIAGNOSIS — K8500 Idiopathic acute pancreatitis without necrosis or infection: Secondary | ICD-10-CM | POA: Diagnosis present

## 2020-10-05 DIAGNOSIS — K7200 Acute and subacute hepatic failure without coma: Secondary | ICD-10-CM | POA: Diagnosis not present

## 2020-10-05 DIAGNOSIS — J9690 Respiratory failure, unspecified, unspecified whether with hypoxia or hypercapnia: Secondary | ICD-10-CM

## 2020-10-05 DIAGNOSIS — F419 Anxiety disorder, unspecified: Secondary | ICD-10-CM | POA: Diagnosis present

## 2020-10-05 DIAGNOSIS — K760 Fatty (change of) liver, not elsewhere classified: Secondary | ICD-10-CM | POA: Diagnosis present

## 2020-10-05 DIAGNOSIS — Z978 Presence of other specified devices: Secondary | ICD-10-CM

## 2020-10-05 DIAGNOSIS — R109 Unspecified abdominal pain: Secondary | ICD-10-CM

## 2020-10-05 DIAGNOSIS — Z66 Do not resuscitate: Secondary | ICD-10-CM | POA: Diagnosis not present

## 2020-10-05 DIAGNOSIS — Z811 Family history of alcohol abuse and dependence: Secondary | ICD-10-CM

## 2020-10-05 DIAGNOSIS — Z79899 Other long term (current) drug therapy: Secondary | ICD-10-CM

## 2020-10-05 DIAGNOSIS — F32A Depression, unspecified: Secondary | ICD-10-CM | POA: Diagnosis present

## 2020-10-05 DIAGNOSIS — A419 Sepsis, unspecified organism: Principal | ICD-10-CM | POA: Diagnosis present

## 2020-10-05 DIAGNOSIS — R680 Hypothermia, not associated with low environmental temperature: Secondary | ICD-10-CM | POA: Diagnosis not present

## 2020-10-05 DIAGNOSIS — Z888 Allergy status to other drugs, medicaments and biological substances status: Secondary | ICD-10-CM

## 2020-10-05 DIAGNOSIS — R6521 Severe sepsis with septic shock: Secondary | ICD-10-CM | POA: Diagnosis present

## 2020-10-05 DIAGNOSIS — R739 Hyperglycemia, unspecified: Secondary | ICD-10-CM | POA: Diagnosis not present

## 2020-10-05 DIAGNOSIS — N170 Acute kidney failure with tubular necrosis: Secondary | ICD-10-CM | POA: Diagnosis not present

## 2020-10-05 DIAGNOSIS — J9600 Acute respiratory failure, unspecified whether with hypoxia or hypercapnia: Secondary | ICD-10-CM

## 2020-10-05 DIAGNOSIS — N179 Acute kidney failure, unspecified: Secondary | ICD-10-CM

## 2020-10-05 DIAGNOSIS — R791 Abnormal coagulation profile: Secondary | ICD-10-CM | POA: Diagnosis present

## 2020-10-05 DIAGNOSIS — R0902 Hypoxemia: Secondary | ICD-10-CM

## 2020-10-05 DIAGNOSIS — Z452 Encounter for adjustment and management of vascular access device: Secondary | ICD-10-CM

## 2020-10-05 DIAGNOSIS — Z20822 Contact with and (suspected) exposure to covid-19: Secondary | ICD-10-CM | POA: Diagnosis present

## 2020-10-05 DIAGNOSIS — D72829 Elevated white blood cell count, unspecified: Secondary | ICD-10-CM

## 2020-10-05 DIAGNOSIS — M79A3 Nontraumatic compartment syndrome of abdomen: Secondary | ICD-10-CM | POA: Diagnosis not present

## 2020-10-05 DIAGNOSIS — R7401 Elevation of levels of liver transaminase levels: Secondary | ICD-10-CM

## 2020-10-05 DIAGNOSIS — Z818 Family history of other mental and behavioral disorders: Secondary | ICD-10-CM

## 2020-10-05 DIAGNOSIS — B159 Hepatitis A without hepatic coma: Secondary | ICD-10-CM | POA: Diagnosis present

## 2020-10-05 HISTORY — DX: Anxiety disorder, unspecified: F41.9

## 2020-10-05 LAB — CBC
HCT: 41.2 % (ref 36.0–46.0)
Hemoglobin: 12.9 g/dL (ref 12.0–15.0)
MCH: 25.7 pg — ABNORMAL LOW (ref 26.0–34.0)
MCHC: 31.3 g/dL (ref 30.0–36.0)
MCV: 82.2 fL (ref 80.0–100.0)
Platelets: 457 10*3/uL — ABNORMAL HIGH (ref 150–400)
RBC: 5.01 MIL/uL (ref 3.87–5.11)
RDW: 16.8 % — ABNORMAL HIGH (ref 11.5–15.5)
WBC: 21.8 10*3/uL — ABNORMAL HIGH (ref 4.0–10.5)
nRBC: 0 % (ref 0.0–0.2)

## 2020-10-05 LAB — BASIC METABOLIC PANEL
Anion gap: 13 (ref 5–15)
BUN: 13 mg/dL (ref 6–20)
CO2: 22 mmol/L (ref 22–32)
Calcium: 9.2 mg/dL (ref 8.9–10.3)
Chloride: 103 mmol/L (ref 98–111)
Creatinine, Ser: 0.75 mg/dL (ref 0.44–1.00)
GFR, Estimated: >60 mL/min (ref >60)
Glucose, Bld: 143 mg/dL — ABNORMAL HIGH (ref 70–99)
Potassium: 3.5 mmol/L (ref 3.5–5.1)
Sodium: 138 mmol/L (ref 135–145)

## 2020-10-05 LAB — I-STAT BETA HCG BLOOD, ED (NOT ORDERABLE): I-stat hCG, quantitative: <5 m[IU]/mL (ref <5)

## 2020-10-05 LAB — TROPONIN I (HIGH SENSITIVITY): Troponin I (High Sensitivity): <2 ng/L (ref <18)

## 2020-10-05 MED ORDER — ONDANSETRON 4 MG PO TBDP
4.0000 mg | ORAL_TABLET | Freq: Once | ORAL | Status: AC
  Administered 2020-10-05: 4 mg via ORAL
  Filled 2020-10-05: qty 1

## 2020-10-05 NOTE — ED Triage Notes (Signed)
Patient arrived with complaints of central chest pain and shortness of breath that started today, also reports NV that started about 30 minutes ago. Reports taking Midol and her anxiety medications about an hour prior to arrival.

## 2020-10-06 ENCOUNTER — Inpatient Hospital Stay (HOSPITAL_COMMUNITY): Payer: BC Managed Care – PPO

## 2020-10-06 ENCOUNTER — Emergency Department (HOSPITAL_COMMUNITY): Payer: BC Managed Care – PPO

## 2020-10-06 ENCOUNTER — Encounter (HOSPITAL_COMMUNITY): Payer: Self-pay

## 2020-10-06 DIAGNOSIS — Z20822 Contact with and (suspected) exposure to covid-19: Secondary | ICD-10-CM | POA: Diagnosis present

## 2020-10-06 DIAGNOSIS — N179 Acute kidney failure, unspecified: Secondary | ICD-10-CM | POA: Diagnosis not present

## 2020-10-06 DIAGNOSIS — R188 Other ascites: Secondary | ICD-10-CM | POA: Diagnosis present

## 2020-10-06 DIAGNOSIS — F32A Depression, unspecified: Secondary | ICD-10-CM | POA: Diagnosis present

## 2020-10-06 DIAGNOSIS — K8500 Idiopathic acute pancreatitis without necrosis or infection: Secondary | ICD-10-CM

## 2020-10-06 DIAGNOSIS — K7200 Acute and subacute hepatic failure without coma: Secondary | ICD-10-CM | POA: Diagnosis not present

## 2020-10-06 DIAGNOSIS — K859 Acute pancreatitis without necrosis or infection, unspecified: Secondary | ICD-10-CM | POA: Diagnosis present

## 2020-10-06 DIAGNOSIS — R001 Bradycardia, unspecified: Secondary | ICD-10-CM | POA: Diagnosis not present

## 2020-10-06 DIAGNOSIS — R140 Abdominal distension (gaseous): Secondary | ICD-10-CM | POA: Diagnosis not present

## 2020-10-06 DIAGNOSIS — N170 Acute kidney failure with tubular necrosis: Secondary | ICD-10-CM | POA: Diagnosis not present

## 2020-10-06 DIAGNOSIS — E875 Hyperkalemia: Secondary | ICD-10-CM | POA: Diagnosis not present

## 2020-10-06 DIAGNOSIS — E669 Obesity, unspecified: Secondary | ICD-10-CM | POA: Diagnosis present

## 2020-10-06 DIAGNOSIS — K760 Fatty (change of) liver, not elsewhere classified: Secondary | ICD-10-CM | POA: Diagnosis present

## 2020-10-06 DIAGNOSIS — R9431 Abnormal electrocardiogram [ECG] [EKG]: Secondary | ICD-10-CM | POA: Diagnosis not present

## 2020-10-06 DIAGNOSIS — B159 Hepatitis A without hepatic coma: Secondary | ICD-10-CM | POA: Diagnosis present

## 2020-10-06 DIAGNOSIS — R791 Abnormal coagulation profile: Secondary | ICD-10-CM | POA: Diagnosis present

## 2020-10-06 DIAGNOSIS — R6521 Severe sepsis with septic shock: Secondary | ICD-10-CM | POA: Diagnosis not present

## 2020-10-06 DIAGNOSIS — R739 Hyperglycemia, unspecified: Secondary | ICD-10-CM | POA: Diagnosis not present

## 2020-10-06 DIAGNOSIS — F419 Anxiety disorder, unspecified: Secondary | ICD-10-CM | POA: Diagnosis present

## 2020-10-06 DIAGNOSIS — M79A3 Nontraumatic compartment syndrome of abdomen: Secondary | ICD-10-CM | POA: Diagnosis not present

## 2020-10-06 DIAGNOSIS — A419 Sepsis, unspecified organism: Secondary | ICD-10-CM | POA: Diagnosis not present

## 2020-10-06 DIAGNOSIS — R34 Anuria and oliguria: Secondary | ICD-10-CM | POA: Diagnosis present

## 2020-10-06 DIAGNOSIS — J9601 Acute respiratory failure with hypoxia: Secondary | ICD-10-CM | POA: Diagnosis not present

## 2020-10-06 DIAGNOSIS — J9600 Acute respiratory failure, unspecified whether with hypoxia or hypercapnia: Secondary | ICD-10-CM | POA: Diagnosis not present

## 2020-10-06 DIAGNOSIS — Z66 Do not resuscitate: Secondary | ICD-10-CM | POA: Diagnosis not present

## 2020-10-06 DIAGNOSIS — D6489 Other specified anemias: Secondary | ICD-10-CM | POA: Diagnosis present

## 2020-10-06 DIAGNOSIS — R680 Hypothermia, not associated with low environmental temperature: Secondary | ICD-10-CM | POA: Diagnosis not present

## 2020-10-06 DIAGNOSIS — I10 Essential (primary) hypertension: Secondary | ICD-10-CM | POA: Diagnosis present

## 2020-10-06 DIAGNOSIS — E8770 Fluid overload, unspecified: Secondary | ICD-10-CM | POA: Diagnosis not present

## 2020-10-06 LAB — COMPREHENSIVE METABOLIC PANEL
ALT: 41 U/L (ref 0–44)
AST: 60 U/L — ABNORMAL HIGH (ref 15–41)
Albumin: 2.7 g/dL — ABNORMAL LOW (ref 3.5–5.0)
Alkaline Phosphatase: 61 U/L (ref 38–126)
Anion gap: 22 — ABNORMAL HIGH (ref 5–15)
BUN: 14 mg/dL (ref 6–20)
CO2: 14 mmol/L — ABNORMAL LOW (ref 22–32)
Calcium: 7.6 mg/dL — ABNORMAL LOW (ref 8.9–10.3)
Chloride: 103 mmol/L (ref 98–111)
Creatinine, Ser: 1.44 mg/dL — ABNORMAL HIGH (ref 0.44–1.00)
GFR, Estimated: 53 mL/min — ABNORMAL LOW (ref >60)
Glucose, Bld: 209 mg/dL — ABNORMAL HIGH (ref 70–99)
Potassium: 6.1 mmol/L — ABNORMAL HIGH (ref 3.5–5.1)
Sodium: 139 mmol/L (ref 135–145)
Total Bilirubin: 0.8 mg/dL (ref 0.3–1.2)
Total Protein: 6.2 g/dL — ABNORMAL LOW (ref 6.5–8.1)

## 2020-10-06 LAB — RAPID URINE DRUG SCREEN, HOSP PERFORMED
Amphetamines: NOT DETECTED
Barbiturates: NOT DETECTED
Benzodiazepines: NOT DETECTED
Cocaine: NOT DETECTED
Opiates: POSITIVE — AB
Tetrahydrocannabinol: POSITIVE — AB

## 2020-10-06 LAB — CBC
HCT: 50.3 % — ABNORMAL HIGH (ref 36.0–46.0)
Hemoglobin: 14.6 g/dL (ref 12.0–15.0)
MCH: 25.7 pg — ABNORMAL LOW (ref 26.0–34.0)
MCHC: 29 g/dL — ABNORMAL LOW (ref 30.0–36.0)
MCV: 88.7 fL (ref 80.0–100.0)
Platelets: 512 10*3/uL — ABNORMAL HIGH (ref 150–400)
RBC: 5.67 MIL/uL — ABNORMAL HIGH (ref 3.87–5.11)
RDW: 17.7 % — ABNORMAL HIGH (ref 11.5–15.5)
WBC: 49.1 10*3/uL — ABNORMAL HIGH (ref 4.0–10.5)
nRBC: 0.1 % (ref 0.0–0.2)

## 2020-10-06 LAB — DIFFERENTIAL
Abs Immature Granulocytes: 0.37 10*3/uL — ABNORMAL HIGH (ref 0.00–0.07)
Basophils Absolute: 0.1 10*3/uL (ref 0.0–0.1)
Basophils Relative: 0 %
Eosinophils Absolute: 0.4 10*3/uL (ref 0.0–0.5)
Eosinophils Relative: 2 %
Immature Granulocytes: 2 %
Lymphocytes Relative: 27 %
Lymphs Abs: 6 10*3/uL — ABNORMAL HIGH (ref 0.7–4.0)
Monocytes Absolute: 1.3 10*3/uL — ABNORMAL HIGH (ref 0.1–1.0)
Monocytes Relative: 6 %
Neutro Abs: 13.7 10*3/uL — ABNORMAL HIGH (ref 1.7–7.7)
Neutrophils Relative %: 63 %

## 2020-10-06 LAB — RESPIRATORY PANEL BY RT PCR (FLU A&B, COVID)
Influenza A by PCR: NEGATIVE
Influenza B by PCR: NEGATIVE
SARS Coronavirus 2 by RT PCR: NEGATIVE

## 2020-10-06 LAB — CBC WITH DIFFERENTIAL/PLATELET
Abs Immature Granulocytes: 0.65 10*3/uL — ABNORMAL HIGH (ref 0.00–0.07)
Basophils Absolute: 0.1 10*3/uL (ref 0.0–0.1)
Basophils Relative: 0 %
Eosinophils Absolute: 0 10*3/uL (ref 0.0–0.5)
Eosinophils Relative: 0 %
HCT: 47.4 % — ABNORMAL HIGH (ref 36.0–46.0)
Hemoglobin: 14.3 g/dL (ref 12.0–15.0)
Immature Granulocytes: 2 %
Lymphocytes Relative: 6 %
Lymphs Abs: 2.4 10*3/uL (ref 0.7–4.0)
MCH: 25.5 pg — ABNORMAL LOW (ref 26.0–34.0)
MCHC: 30.2 g/dL (ref 30.0–36.0)
MCV: 84.5 fL (ref 80.0–100.0)
Monocytes Absolute: 2.4 10*3/uL — ABNORMAL HIGH (ref 0.1–1.0)
Monocytes Relative: 6 %
Neutro Abs: 34.7 10*3/uL — ABNORMAL HIGH (ref 1.7–7.7)
Neutrophils Relative %: 86 %
Platelets: 525 10*3/uL — ABNORMAL HIGH (ref 150–400)
RBC: 5.61 MIL/uL — ABNORMAL HIGH (ref 3.87–5.11)
RDW: 17.6 % — ABNORMAL HIGH (ref 11.5–15.5)
WBC: 40.3 10*3/uL — ABNORMAL HIGH (ref 4.0–10.5)
nRBC: 0 % (ref 0.0–0.2)

## 2020-10-06 LAB — HEPATIC FUNCTION PANEL
ALT: 55 U/L — ABNORMAL HIGH (ref 0–44)
ALT: 48 U/L — ABNORMAL HIGH (ref 0–44)
AST: 46 U/L — ABNORMAL HIGH (ref 15–41)
AST: 47 U/L — ABNORMAL HIGH (ref 15–41)
Albumin: 3.3 g/dL — ABNORMAL LOW (ref 3.5–5.0)
Albumin: 3 g/dL — ABNORMAL LOW (ref 3.5–5.0)
Alkaline Phosphatase: 79 U/L (ref 38–126)
Alkaline Phosphatase: 73 U/L (ref 38–126)
Bilirubin, Direct: 0.1 mg/dL (ref 0.0–0.2)
Bilirubin, Direct: 0.1 mg/dL (ref 0.0–0.2)
Indirect Bilirubin: 0.7 mg/dL (ref 0.3–0.9)
Indirect Bilirubin: 0.3 mg/dL (ref 0.3–0.9)
Total Bilirubin: 0.8 mg/dL (ref 0.3–1.2)
Total Bilirubin: 0.4 mg/dL (ref 0.3–1.2)
Total Protein: 7.6 g/dL (ref 6.5–8.1)
Total Protein: 7.1 g/dL (ref 6.5–8.1)

## 2020-10-06 LAB — URINALYSIS, ROUTINE W REFLEX MICROSCOPIC
Bacteria, UA: NONE SEEN
Bilirubin Urine: NEGATIVE
Glucose, UA: NEGATIVE mg/dL
Ketones, ur: 5 mg/dL — AB
Leukocytes,Ua: NEGATIVE
Nitrite: NEGATIVE
Protein, ur: NEGATIVE mg/dL
Specific Gravity, Urine: 1.034 — ABNORMAL HIGH (ref 1.005–1.030)
pH: 5 (ref 5.0–8.0)

## 2020-10-06 LAB — BLOOD GAS, ARTERIAL
Acid-base deficit: 17.6 mmol/L — ABNORMAL HIGH (ref 0.0–2.0)
Bicarbonate: 11.3 mmol/L — ABNORMAL LOW (ref 20.0–28.0)
Drawn by: 422461
O2 Saturation: 97.2 %
Patient temperature: 98.6
pCO2 arterial: 39 mmHg (ref 32.0–48.0)
pH, Arterial: 7.091 — CL (ref 7.350–7.450)
pO2, Arterial: 124 mmHg — ABNORMAL HIGH (ref 83.0–108.0)

## 2020-10-06 LAB — CBG MONITORING, ED
Glucose-Capillary: 191 mg/dL — ABNORMAL HIGH (ref 70–99)
Glucose-Capillary: 170 mg/dL — ABNORMAL HIGH (ref 70–99)
Glucose-Capillary: 226 mg/dL — ABNORMAL HIGH (ref 70–99)
Glucose-Capillary: 295 mg/dL — ABNORMAL HIGH (ref 70–99)

## 2020-10-06 LAB — LACTIC ACID, PLASMA
Lactic Acid, Venous: 10.9 mmol/L (ref 0.5–1.9)
Lactic Acid, Venous: 9.2 mmol/L (ref 0.5–1.9)

## 2020-10-06 LAB — TROPONIN I (HIGH SENSITIVITY): Troponin I (High Sensitivity): <2 ng/L (ref <18)

## 2020-10-06 LAB — GLUCOSE, CAPILLARY: Glucose-Capillary: 172 mg/dL — ABNORMAL HIGH (ref 70–99)

## 2020-10-06 LAB — CREATININE, SERUM
Creatinine, Ser: 0.78 mg/dL (ref 0.44–1.00)
GFR, Estimated: >60 mL/min (ref >60)

## 2020-10-06 LAB — TRIGLYCERIDES: Triglycerides: 181 mg/dL — ABNORMAL HIGH (ref <150)

## 2020-10-06 LAB — LIPASE, BLOOD
Lipase: 1346 U/L — ABNORMAL HIGH (ref 11–51)
Lipase: 5525 U/L — ABNORMAL HIGH (ref 11–51)

## 2020-10-06 LAB — HIV ANTIBODY (ROUTINE TESTING W REFLEX): HIV Screen 4th Generation wRfx: NONREACTIVE

## 2020-10-06 LAB — PROCALCITONIN: Procalcitonin: 3.89 ng/mL

## 2020-10-06 MED ORDER — SODIUM CHLORIDE 0.9 % IV SOLN
INTRAVENOUS | Status: DC
  Administered 2020-10-08: 1000 mL via INTRAVENOUS

## 2020-10-06 MED ORDER — ACETAMINOPHEN 650 MG RE SUPP
650.0000 mg | Freq: Four times a day (QID) | RECTAL | Status: DC | PRN
  Administered 2020-10-06: 650 mg via RECTAL
  Filled 2020-10-06: qty 1

## 2020-10-06 MED ORDER — INSULIN ASPART 100 UNIT/ML IV SOLN
10.0000 [IU] | Freq: Once | INTRAVENOUS | Status: AC
  Administered 2020-10-06: 10 [IU] via INTRAVENOUS
  Filled 2020-10-06: qty 0.1

## 2020-10-06 MED ORDER — PIPERACILLIN-TAZOBACTAM 3.375 G IVPB 30 MIN
3.3750 g | Freq: Once | INTRAVENOUS | Status: AC
  Administered 2020-10-06: 3.375 g via INTRAVENOUS
  Filled 2020-10-06: qty 50

## 2020-10-06 MED ORDER — PROMETHAZINE HCL 25 MG/ML IJ SOLN
INTRAMUSCULAR | Status: AC
  Administered 2020-10-06: 12.5 mg via INTRAVENOUS
  Filled 2020-10-06: qty 1

## 2020-10-06 MED ORDER — PIPERACILLIN-TAZOBACTAM 3.375 G IVPB
3.3750 g | Freq: Three times a day (TID) | INTRAVENOUS | Status: DC
  Administered 2020-10-06: 3.375 g via INTRAVENOUS
  Filled 2020-10-06: qty 50

## 2020-10-06 MED ORDER — ALBUMIN HUMAN 5 % IV SOLN
25.0000 g | Freq: Once | INTRAVENOUS | Status: AC
  Administered 2020-10-06: 25 g via INTRAVENOUS
  Filled 2020-10-06: qty 500

## 2020-10-06 MED ORDER — MORPHINE SULFATE (PF) 2 MG/ML IV SOLN
2.0000 mg | INTRAVENOUS | Status: DC | PRN
  Administered 2020-10-06 (×3): 2 mg via INTRAVENOUS
  Filled 2020-10-06 (×3): qty 1

## 2020-10-06 MED ORDER — SODIUM CHLORIDE 0.9 % IV BOLUS
1000.0000 mL | Freq: Once | INTRAVENOUS | Status: AC
  Administered 2020-10-06: 1000 mL via INTRAVENOUS

## 2020-10-06 MED ORDER — ORAL CARE MOUTH RINSE: 15.0000 mL | Freq: Two times a day (BID) | OROMUCOSAL | Status: DC

## 2020-10-06 MED ORDER — LACTATED RINGERS IV SOLN: INTRAVENOUS | Status: DC

## 2020-10-06 MED ORDER — QUETIAPINE FUMARATE 300 MG PO TABS: 400.0000 mg | ORAL_TABLET | Freq: Every day | ORAL | Status: DC

## 2020-10-06 MED ORDER — ONDANSETRON HCL 4 MG/2ML IJ SOLN: 4.0000 mg | Freq: Once | INTRAMUSCULAR | Status: AC

## 2020-10-06 MED ORDER — IOHEXOL 300 MG/ML  SOLN
100.0000 mL | Freq: Once | INTRAMUSCULAR | Status: AC | PRN
  Administered 2020-10-06: 100 mL via INTRAVENOUS

## 2020-10-06 MED ORDER — HYDROMORPHONE HCL 1 MG/ML IJ SOLN: 1.0000 mg | Freq: Once | INTRAMUSCULAR | Status: AC

## 2020-10-06 MED ORDER — PHENYLEPHRINE HCL-NACL 10-0.9 MG/250ML-% IV SOLN
0.0000 ug/min | INTRAVENOUS | Status: DC
  Administered 2020-10-06: 0.2 ug/min via INTRAVENOUS
  Administered 2020-10-07: 100 ug/min via INTRAVENOUS
  Administered 2020-10-07: 60 ug/min via INTRAVENOUS
  Administered 2020-10-07: 50 ug/min via INTRAVENOUS
  Administered 2020-10-07: 100 ug/min via INTRAVENOUS
  Administered 2020-10-07: 80 ug/min via INTRAVENOUS
  Administered 2020-10-07: 100 ug/min via INTRAVENOUS
  Administered 2020-10-07: 70 ug/min via INTRAVENOUS
  Administered 2020-10-07: 80 ug/min via INTRAVENOUS
  Administered 2020-10-08 (×6): 200 ug/min via INTRAVENOUS
  Filled 2020-10-06 (×19): qty 250

## 2020-10-06 MED ORDER — CHLORHEXIDINE GLUCONATE 0.12 % MT SOLN
15.0000 mL | Freq: Two times a day (BID) | OROMUCOSAL | Status: DC
  Administered 2020-10-06: 15 mL via OROMUCOSAL
  Filled 2020-10-06: qty 15

## 2020-10-06 MED ORDER — ACETAMINOPHEN 325 MG PO TABS
650.0000 mg | ORAL_TABLET | Freq: Four times a day (QID) | ORAL | Status: DC | PRN
  Administered 2020-10-06: 650 mg via ORAL
  Filled 2020-10-06: qty 2

## 2020-10-06 MED ORDER — CHLORHEXIDINE GLUCONATE CLOTH 2 % EX PADS
6.0000 | MEDICATED_PAD | Freq: Every day | CUTANEOUS | Status: DC
  Administered 2020-10-07 – 2020-10-08 (×2): 6 via TOPICAL

## 2020-10-06 MED ORDER — ENOXAPARIN SODIUM 40 MG/0.4ML ~~LOC~~ SOLN
40.0000 mg | SUBCUTANEOUS | Status: DC
  Administered 2020-10-06: 40 mg via SUBCUTANEOUS
  Filled 2020-10-06: qty 0.4

## 2020-10-06 MED ORDER — CLONAZEPAM 0.5 MG PO TABS
1.0000 mg | ORAL_TABLET | Freq: Two times a day (BID) | ORAL | Status: DC
  Administered 2020-10-06: 1 mg via ORAL
  Filled 2020-10-06: qty 2

## 2020-10-06 MED ORDER — SODIUM CHLORIDE 0.9 % IV SOLN
1.0000 g | Freq: Three times a day (TID) | INTRAVENOUS | Status: DC
  Administered 2020-10-06: 1 g via INTRAVENOUS
  Filled 2020-10-06 (×2): qty 1

## 2020-10-06 MED ORDER — CALCIUM GLUCONATE-NACL 1-0.675 GM/50ML-% IV SOLN
1.0000 g | INTRAVENOUS | Status: AC
  Administered 2020-10-06: 1000 mg via INTRAVENOUS
  Filled 2020-10-06: qty 50

## 2020-10-06 MED ORDER — FENTANYL CITRATE (PF) 100 MCG/2ML IJ SOLN: 100.0000 ug | Freq: Once | INTRAMUSCULAR | Status: AC

## 2020-10-06 MED ORDER — METOPROLOL TARTRATE 5 MG/5ML IV SOLN
5.0000 mg | Freq: Four times a day (QID) | INTRAVENOUS | Status: DC | PRN
  Administered 2020-10-06: 5 mg via INTRAVENOUS
  Filled 2020-10-06: qty 5

## 2020-10-06 MED ORDER — ONDANSETRON HCL 4 MG/2ML IJ SOLN
4.0000 mg | Freq: Four times a day (QID) | INTRAMUSCULAR | Status: DC | PRN
  Administered 2020-10-06 – 2020-10-07 (×2): 4 mg via INTRAVENOUS
  Filled 2020-10-06 (×2): qty 2

## 2020-10-06 MED ORDER — FENTANYL CITRATE (PF) 100 MCG/2ML IJ SOLN
INTRAMUSCULAR | Status: AC
  Administered 2020-10-06: 100 ug via INTRAVENOUS
  Filled 2020-10-06: qty 2

## 2020-10-06 MED ORDER — LACTATED RINGERS IV BOLUS
2000.0000 mL | Freq: Once | INTRAVENOUS | Status: AC
  Administered 2020-10-06: 2000 mL via INTRAVENOUS

## 2020-10-06 MED ORDER — HYDROMORPHONE HCL 1 MG/ML IJ SOLN
INTRAMUSCULAR | Status: AC
  Administered 2020-10-06: 1 mg via INTRAVENOUS
  Filled 2020-10-06: qty 1

## 2020-10-06 MED ORDER — LACTATED RINGERS IV BOLUS
1000.0000 mL | Freq: Once | INTRAVENOUS | Status: AC
  Administered 2020-10-06: 1000 mL via INTRAVENOUS

## 2020-10-06 MED ORDER — FENTANYL CITRATE (PF) 100 MCG/2ML IJ SOLN
50.0000 ug | INTRAMUSCULAR | Status: DC | PRN
  Administered 2020-10-06 – 2020-10-08 (×2): 50 ug via INTRAVENOUS
  Filled 2020-10-06 (×2): qty 2

## 2020-10-06 MED ORDER — PANTOPRAZOLE SODIUM 40 MG IV SOLR
40.0000 mg | INTRAVENOUS | Status: DC
  Administered 2020-10-06 – 2020-10-08 (×3): 40 mg via INTRAVENOUS
  Filled 2020-10-06 (×3): qty 40

## 2020-10-06 MED ORDER — LORAZEPAM 2 MG/ML IJ SOLN
1.0000 mg | INTRAMUSCULAR | Status: DC | PRN
  Administered 2020-10-06 – 2020-10-07 (×2): 1 mg via INTRAVENOUS
  Filled 2020-10-06 (×2): qty 1

## 2020-10-06 MED ORDER — FLUOXETINE HCL 20 MG PO CAPS
20.0000 mg | ORAL_CAPSULE | Freq: Every day | ORAL | Status: DC
  Administered 2020-10-06: 20 mg via ORAL
  Filled 2020-10-06: qty 1

## 2020-10-06 MED ORDER — PROMETHAZINE HCL 25 MG/ML IJ SOLN
12.5000 mg | Freq: Once | INTRAMUSCULAR | Status: AC
  Administered 2020-10-06: 12.5 mg via INTRAVENOUS

## 2020-10-06 MED ORDER — ALBUMIN HUMAN 5 % IV SOLN
25.0000 g | Freq: Once | INTRAVENOUS | Status: AC
  Administered 2020-10-07: 25 g via INTRAVENOUS
  Filled 2020-10-06: qty 500

## 2020-10-06 MED ORDER — PROMETHAZINE HCL 25 MG/ML IJ SOLN: 12.5000 mg | Freq: Once | INTRAMUSCULAR | Status: AC

## 2020-10-06 MED ORDER — DEXTROSE 50 % IV SOLN
1.0000 | Freq: Once | INTRAVENOUS | Status: AC
  Administered 2020-10-06: 50 mL via INTRAVENOUS
  Filled 2020-10-06: qty 50

## 2020-10-06 MED ORDER — HYDROMORPHONE HCL 1 MG/ML IJ SOLN: 1.0000 mg | INTRAMUSCULAR | Status: DC | PRN

## 2020-10-06 MED ORDER — INSULIN ASPART 100 UNIT/ML ~~LOC~~ SOLN
3.0000 [IU] | SUBCUTANEOUS | Status: DC
  Administered 2020-10-06: 6 [IU] via SUBCUTANEOUS
  Administered 2020-10-07: 9 [IU] via SUBCUTANEOUS
  Filled 2020-10-06: qty 0.09

## 2020-10-06 MED ORDER — ONDANSETRON HCL 4 MG/2ML IJ SOLN
INTRAMUSCULAR | Status: AC
  Administered 2020-10-06: 4 mg via INTRAVENOUS
  Filled 2020-10-06: qty 2

## 2020-10-06 MED ORDER — SODIUM CHLORIDE (PF) 0.9 % IJ SOLN
INTRAMUSCULAR | Status: AC
  Filled 2020-10-06: qty 50

## 2020-10-06 MED ORDER — HYDROMORPHONE HCL 1 MG/ML IJ SOLN
1.0000 mg | INTRAMUSCULAR | Status: DC | PRN
  Administered 2020-10-06 (×3): 1 mg via INTRAVENOUS
  Filled 2020-10-06 (×3): qty 1

## 2020-10-06 MED ORDER — SODIUM CHLORIDE 0.9 % IV SOLN: INTRAVENOUS | Status: DC

## 2020-10-06 MED ORDER — SODIUM BICARBONATE 8.4 % IV SOLN
100.0000 meq | Freq: Once | INTRAVENOUS | Status: AC
  Administered 2020-10-07: 100 meq via INTRAVENOUS
  Filled 2020-10-06: qty 100

## 2020-10-06 MED ORDER — PIPERACILLIN-TAZOBACTAM 3.375 G IVPB 30 MIN: 3.3750 g | Freq: Three times a day (TID) | INTRAVENOUS | Status: DC

## 2020-10-06 NOTE — ED Notes (Signed)
Date and time results received: 10/06/20 8:21 PM  (use smartphrase ".now" to insert current time)  Test: Lactic Acid Critical Value: 10.9  Name of Provider Notified: Dr.Kakrakandy  Orders Received? Or Actions Taken?:

## 2020-10-06 NOTE — Consult Note (Signed)
NAME:  Arzu Mcgaughey, MRN:  [id number], DOB:  06/16/2001, LOS: 0 ADMISSION DATE:  09/20/2020, CONSULTATION DATE:  10/06/20 REFERRING MD:  Alwyn Ren, MD CHIEF COMPLAINT:  Belly pain   Brief History   19 year old who presented to the ED with severe abdominal pain found to have acute idiopathic pancreatitis.  History of present illness   Patient had onset of belly pain about 1 to 2 days ago.  Is associated with nausea with vomiting.  She points to the top of her belly.  Does endorse some radiation to the back.  No shortness of breath.  Has developed a little bit of chest pain over the last few hours.  No fever or chills.  She drinks irregularly, had 3 white claws last week.  No significant binge drinking recently.  She does not do IV drugs.  Does smoke marijuana on occasion.  No obvious medication culprits for pancreatitis on her medication list.  In the ED, she has been intermittently tachycardic.  She is demonstrated saturations in the mid to high 90s on room air.  Her blood pressure has been okay.  Her hemoglobin and hematocrit have increased despite aggressive hydration.  Leukocytosis climbed 20,000-40,000.  Lipase 1300.  No hyperbilirubinemia.  LFTs mildly elevated.  Noted to have some fatty liver on imaging.  Triglycerides only 181.  She endorses good urine output with the immense amount of IV fluid she is receiving  Past Medical History  Anxiety  Significant Hospital Events   Admitted 10/21  Consults:  GI, PCCM  Procedures:  N/A  Significant Diagnostic Tests:  CT abdomen pelvis personally reviewed with stranding along pancreas to my eye, formal read as acute pancreatitis with small ascites Ultrasound abdomen personally reviewed with no pancreatic ductal dilation, no evidence of cholelithiasis or cholecystitis, no biliary ductal dilation  Micro Data:  Blood cultures pending  Antimicrobials:  N/A  Interim history/subjective:  As above  Objective   Blood pressure  135/89, pulse (!) 145, temperature 98.1 F (36.7 C), resp. rate (!) 22, weight 83.9 kg, last menstrual period 09/25/2020, SpO2 92 %.        Intake/Output Summary (Last 24 hours) at 10/06/2020 1222 Last data filed at 10/06/2020 0539 Gross per 24 hour  Intake 1000 ml  Output --  Net 1000 ml   Filed Weights   10/06/20 0040  Weight: 83.9 kg    Examination: General: Lying in bed, in mild distress Eyes: EOMI, no icterus Cardiovascular: Tachycardic, no murmurs Respiratory: Clear aspiration bilaterally, no wheeze Abdomen: Deferred given diffuse pain   Resolved Hospital Problem list     Assessment & Plan:  Severe pancreatitis: Presumed idiopathic.  Denies any alcohol use.  No obvious medication culprits.  Triglycerides not severely elevated.  She is alert, interactive.  No increased work of breathing.  Seems hydromorphone has improved some physiologic parameters.  Suspect she is quite hypovolemic is only received 3 L IV fluid bolus last at 3 AM.  Suspect this is the reason for some worsening physiologic conditions as well as changes in her labs that are concerning including increase in hemoconcentration as well as leukocytosis.  Recently increase maintenance rate to 500 cc/h.  Discussed case at length with primary hospitalist via phone. --Aggressive hydration, agree with high maintenance IV fluid rate --Aggressive pain control, Dilaudid as needed has been more help with the morphine, consider PCA with on demand dosing only if PRN does not control pain  Best practice:  Per primary team  Labs  CBC: Recent Labs  Lab 10/16/2020 2203 10/06/20 0744  WBC 21.8* 40.3*  NEUTROABS 13.7* 34.7*  HGB 12.9 14.3  HCT 41.2 47.4*  MCV 82.2 84.5  PLT 457* 525*    Basic Metabolic Panel: Recent Labs  Lab 10/03/2020 2203 10/06/20 0744  NA 138  --   K 3.5  --   CL 103  --   CO2 22  --   GLUCOSE 143*  --   BUN 13  --   CREATININE 0.75 0.78  CALCIUM 9.2  --    GFR: Estimated Creatinine  Clearance: 117.6 mL/min (by C-G formula based on SCr of 0.78 mg/dL). Recent Labs  Lab 09/22/2020 2203 10/06/20 0744  WBC 21.8* 40.3*    Liver Function Tests: Recent Labs  Lab 10/06/20 0022 10/06/20 0744  AST 46* 47*  ALT 55* 48*  ALKPHOS 79 73  BILITOT 0.8 0.4  PROT 7.6 7.1  ALBUMIN 3.3* 3.0*   Recent Labs  Lab 10/06/20 0022  LIPASE 1,346*   No results for input(s): AMMONIA in the last 168 hours.  ABG No results found for: PHART, PCO2ART, PO2ART, HCO3, TCO2, ACIDBASEDEF, O2SAT   Coagulation Profile: No results for input(s): INR, PROTIME in the last 168 hours.  Cardiac Enzymes: No results for input(s): CKTOTAL, CKMB, CKMBINDEX, TROPONINI in the last 168 hours.  HbA1C: No results found for: HGBA1C  CBG: Recent Labs  Lab 10/06/20 0732 10/06/20 1156  GLUCAP 191* 170*    Review of Systems:   No diarrhea.  No orthopnea or PND.  No dyspnea exertion.  Conference review of systems otherwise negative.  Past Medical History  She,  has a past medical history of Anxiety.   Surgical History    Past Surgical History:  Procedure Laterality Date  . WISDOM TOOTH EXTRACTION       Social History   reports that she has quit smoking. She has never used smokeless tobacco. She reports previous alcohol use. She reports previous drug use.   Family History   Her family history includes Alcohol abuse in her half-brother and mother; Depression in her half-brother and mother; Post-traumatic stress disorder in her half-sister.   Allergies Allergies  Allergen Reactions  . Lamictal [Lamotrigine]     Severe anger     Home Medications  Prior to Admission medications   Medication Sig Start Date End Date Taking? Authorizing Provider  clonazePAM (KLONOPIN) 1 MG tablet Take 1 tablet (1 mg total) by mouth daily as needed for anxiety. 10/09/20 11/08/20 Yes Corie Chiquito, PMHNP  FLUoxetine (PROZAC) 20 MG capsule Take 1 capsule (20 mg total) by mouth daily. 09/28/20  Yes Corie Chiquito, PMHNP  QUEtiapine (SEROQUEL) 400 MG tablet Take 1 tablet (400 mg total) by mouth at bedtime. 09/28/20 10/28/20 Yes Corie Chiquito, PMHNP     Critical care time:     CRITICAL CARE Performed by: Karren Burly   Total critical care time: 30 minutes  Critical care time was exclusive of separately billable procedures and treating other patients.  Critical care was necessary to treat or prevent imminent or life-threatening deterioration.  Critical care was time spent personally by me on the following activities: development of treatment plan with patient and/or surrogate as well as nursing, discussions with consultants, evaluation of patient's response to treatment, examination of patient, obtaining history from patient or surrogate, ordering and performing treatments and interventions, ordering and review of laboratory studies, ordering and review of radiographic studies, pulse oximetry and re-evaluation of patient's condition.

## 2020-10-06 NOTE — Progress Notes (Signed)
  Echocardiogram 2D Echocardiogram has been attempted. [id number]  too elevated. Will reattempt at later date.  Myishia Kasik G Mattthew Ziomek 10/06/2020, 2:03 PM

## 2020-10-06 NOTE — Progress Notes (Signed)
PHARMACY NOTE -  meropenem  Pharmacy has been assisting with dosing of meropenem for septic shock.  Dosage remains stable at 1g IV q8 hr and Pharmacy may adjust for future renal changes per standing protocol  Pharmacy will sign off, following peripherally for culture results or dose adjustments. Please reconsult if a change in clinical status warrants re-evaluation of dosage.  Bernadene Person, PharmD, BCPS [phone] 10/06/2020, 8:59 PM

## 2020-10-06 NOTE — ED Notes (Signed)
Date and time results received: 10/06/20 8:55 PM  (use smartphrase ".now" to insert current time)  Test: ABG pH Critical Value: 7.091  Name of Provider Notified: Dr.Kakrakandy  Orders Received? Or Actions Taken?:

## 2020-10-06 NOTE — Progress Notes (Addendum)
19 yo female with no significant past medical history admitted this morning with severe acute pancreatitis of undetermined etiology.  Work-up triglycerides 181.  No evidence of gallstones.  No history of alcohol use.  Discussed with GI and PCCM. Patient appears to be in moderate distress with abdominal pain and nausea.  Patient is  tachycardic afebrile. Labs indicate worsening leukocytosis and increased H&H in spite of hydration.  Fluid boluses given.  Continue IV fluids.  Follow-up labs later today and tomorrow.  Addendum-patient seen husband by the bed side.  Repeat labs this evening shows worsening leukocytosis renal functions and hyperkalemia with lipase above 5000. We will treat hyperkalemia with calcium gluconate insulin and glucose. Change fluids to normal saline and continue.  Patient seen and husband updated. Did explain to the husband that patient is extremely sick. Updated PCCM.

## 2020-10-06 NOTE — H&P (Signed)
History and Physical    Heather Lynn TXM:[id number] DOB: 01-05-2001 DOA: 10-27-2020  PCP: Assunta Found, MD  Patient coming from: Home.  Chief Complaint: Abdominal pain.  HPI: Heather Lynn is a 19 y.o. female with history of depression presents to the ER with complaints of abdominal pain.  Patient has been a abdominal pain for the last 24 hours with nausea vomiting.  Pain is mostly in the epigastric area radiating to the back.  Denies any chest pain or shortness of breath or denies any fever or chills.  ED Course: In the ER CT abdomen pelvis is consistent with acute pancreatitis.  No definite signs of any gallstones.  Patient denies drinking alcohol.  Triglycerides are 181.  Lipase is 1300.  AST 46 ALT 55.  High sensitive troponins were negative.  EKG shows sinus tachycardia.  Patient was started on fluid bolus pain relief medication admitted for acute pancreatitis cause not clear.  Review of Systems: As per HPI, rest all negative.   Past Medical History:  Diagnosis Date  . Anxiety     Past Surgical History:  Procedure Laterality Date  . WISDOM TOOTH EXTRACTION       reports that she has quit smoking. She has never used smokeless tobacco. She reports previous alcohol use. She reports previous drug use.  Allergies  Allergen Reactions  . Lamictal [Lamotrigine]     Severe anger    Family History  Problem Relation Age of Onset  . Alcohol abuse Mother   . Depression Mother   . Depression Half-Brother   . Alcohol abuse Half-Brother   . Post-traumatic stress disorder Half-Sister     Prior to Admission medications   Medication Sig Start Date End Date Taking? Authorizing Provider  clonazePAM (KLONOPIN) 1 MG tablet Take 1 tablet (1 mg total) by mouth daily as needed for anxiety. 10/09/20 11/08/20 Yes Corie Chiquito, PMHNP  FLUoxetine (PROZAC) 20 MG capsule Take 1 capsule (20 mg total) by mouth daily. 09/28/20  Yes Corie Chiquito, PMHNP  QUEtiapine (SEROQUEL) 400 MG tablet  Take 1 tablet (400 mg total) by mouth at bedtime. 09/28/20 10/28/20 Yes Corie Chiquito, PMHNP    Physical Exam: Constitutional: Moderately built and nourished. Vitals:   10/06/20 0200 10/06/20 0215 10/06/20 0230 10/06/20 0245  BP: (!) 144/98 (!) 144/90 139/76 (!) 149/91  Pulse: (!) 104 (!) 101 (!) 109 (!) 116  Resp: (!) 26 (!) 25 (!) 23 (!) 25  Temp:      TempSrc:      SpO2: 97% 97% 99% 99%  Weight:       Eyes: Anicteric no pallor. ENMT: No discharge from the ears eyes nose or mouth. Neck: No mass felt.  No neck rigidity. Respiratory: No rhonchi or crepitations. Cardiovascular: S1-S2 heard. Abdomen: Epigastric tenderness no guarding or rigidity. Musculoskeletal: No edema. Skin: No rash. Neurologic: Alert awake oriented to time place and person.  Moves all extremities. Psychiatric: Appears normal.  Normal affect.   Labs on Admission: I have personally reviewed following labs and imaging studies  CBC: Recent Labs  Lab Oct 27, 2020 2203  WBC 21.8*  NEUTROABS 13.7*  HGB 12.9  HCT 41.2  MCV 82.2  PLT 457*   Basic Metabolic Panel: Recent Labs  Lab 10-27-20 2203  NA 138  K 3.5  CL 103  CO2 22  GLUCOSE 143*  BUN 13  CREATININE 0.75  CALCIUM 9.2   GFR: Estimated Creatinine Clearance: 117.6 mL/min (by C-G formula based on SCr of 0.75 mg/dL). Liver Function Tests:  Recent Labs  Lab 10/06/20 0022  AST 46*  ALT 55*  ALKPHOS 79  BILITOT 0.8  PROT 7.6  ALBUMIN 3.3*   Recent Labs  Lab 10/06/20 0022  LIPASE 1,346*   No results for input(s): AMMONIA in the last 168 hours. Coagulation Profile: No results for input(s): INR, PROTIME in the last 168 hours. Cardiac Enzymes: No results for input(s): CKTOTAL, CKMB, CKMBINDEX, TROPONINI in the last 168 hours. BNP (last 3 results) No results for input(s): PROBNP in the last 8760 hours. HbA1C: No results for input(s): HGBA1C in the last 72 hours. CBG: No results for input(s): GLUCAP in the last 168 hours. Lipid  Profile: Recent Labs    10/06/20 0304  TRIG 181*   Thyroid Function Tests: No results for input(s): TSH, T4TOTAL, FREET4, T3FREE, THYROIDAB in the last 72 hours. Anemia Panel: No results for input(s): VITAMINB12, FOLATE, FERRITIN, TIBC, IRON, RETICCTPCT in the last 72 hours. Urine analysis:    Component Value Date/Time   COLORURINE STRAW (A) 10/06/2020 0200   APPEARANCEUR CLEAR 10/06/2020 0200   LABSPEC 1.034 (H) 10/06/2020 0200   PHURINE 5.0 10/06/2020 0200   GLUCOSEU NEGATIVE 10/06/2020 0200   HGBUR MODERATE (A) 10/06/2020 0200   BILIRUBINUR NEGATIVE 10/06/2020 0200   KETONESUR 5 (A) 10/06/2020 0200   PROTEINUR NEGATIVE 10/06/2020 0200   NITRITE NEGATIVE 10/06/2020 0200   LEUKOCYTESUR NEGATIVE 10/06/2020 0200   Sepsis Labs: @LABRCNTIP (procalcitonin:4,lacticidven:4) )No results found for this or any previous visit (from the past 240 hour(s)).   Radiological Exams on Admission: DG Chest 2 View  Result Date: 09/23/2020 CLINICAL DATA:  Chest pain EXAM: CHEST - 2 VIEW COMPARISON:  None. FINDINGS: The heart size and mediastinal contours are within normal limits. Both lungs are clear. The visualized skeletal structures are unremarkable. Low lung volumes IMPRESSION: No active cardiopulmonary disease. Electronically Signed   By: 10/07/2020 M.D.   On: 10/15/2020 22:19   CT ABDOMEN PELVIS W CONTRAST  Result Date: 10/06/2020 CLINICAL DATA:  19 year old female with abdominal pain. Nausea and vomiting. EXAM: CT ABDOMEN AND PELVIS WITH CONTRAST TECHNIQUE: Multidetector CT imaging of the abdomen and pelvis was performed using the standard protocol following bolus administration of intravenous contrast. CONTRAST:  26 OMNIPAQUE IOHEXOL 300 MG/ML  SOLN COMPARISON:  None. FINDINGS: Lower chest: Trace bilateral pleural effusions. The visualized lung bases are otherwise clear. No intra-abdominal free air.  Small ascites. Hepatobiliary: Apparent mild fatty liver. No intrahepatic biliary ductal  dilatation. The gallbladder is unremarkable. Pancreas: There is diffuse inflammatory changes of the pancreas with peripancreatic fluid and edema consistent with acute pancreatitis. No drainable fluid collection/abscess or pseudocyst. No dilatation of the main pancreatic duct. Spleen: Normal in size without focal abnormality. Adrenals/Urinary Tract: The adrenal glands unremarkable. The kidneys, visualized ureters, and urinary bladder appear unremarkable. Stomach/Bowel: There is no bowel obstruction or active inflammation. The appendix is normal. Vascular/Lymphatic: The abdominal aorta an IVC unremarkable. The SMV, splenic vein, and main portal vein are patent. No portal venous gas. There is no adenopathy. Reproductive: The uterus is anteverted and grossly unremarkable. No adnexal masses. Other: None Musculoskeletal: No acute or significant osseous findings. IMPRESSION: 1. Acute pancreatitis. No abscess or pseudocyst. 2. Small ascites. 3. Trace bilateral pleural effusions. 4. No bowel obstruction. Normal appendix. Electronically Signed   By: M.D.   On: 10/06/2020 01:37    EKG: Independently reviewed.  Sinus tachycardia.  Assessment/Plan Principal Problem:   Acute pancreatitis    1. Acute pancreatitis cause not clear.  CT  scan does not show any definite evidence of gallstones.  Patient denies drinking alcohol.  Triglycerides are normal.  We will check a sonogram of the abdomen to make sure there is no gallstones.  We will keep patient n.p.o. aggressive hydration follow metabolic panel intake output.  Pain relief medications.  May consult GI in the morning. 2. History of depression presently n.p.o. 3. Leukocytosis likely reactionary.  Since patient is having acute severe pancreatitis will need close monitoring for any further worsening in inpatient status.   DVT prophylaxis: Lovenox. Code Status: Full code. Family Communication: Discussed with patient's husband at the  bedside. Disposition Plan: Home. Consults called: None. Admission status: Inpatient.   Eduard Clos MD [facility] Pager [phone].  If 7PM-7AM, please contact night-coverage [email or web address] Password [facility]  10/06/2020, 4:28 AM

## 2020-10-06 NOTE — Progress Notes (Signed)
Spoke w/ pt's nurse, Tobi Bastos.  Have requested LR be changed to NS @ 200 mL/hr.  Florencia Reasons, M.D. Pager [phone] If no answer or after 5 PM call [phone]

## 2020-10-06 NOTE — Progress Notes (Addendum)
eLink Physician-Brief Progress Note Patient Name: Heather Lynn DOB: 02-Jun-2001 MRN: [id number]   Date of Service  10/06/2020  HPI/Events of Note  Patient with severe idiopathic pancreatitis and shock with a lactate of 10.9, she is hypotensive.  eICU Interventions  1000 ml of LR + 500 ml of 5 % albumin ordered stat, in addition to 1000 ml ordered by Hospitalist and currently infusing, patient was started on a Phenylephrine infusion by the Hospitalist. I have asked PCCM ground crew to see patient in follow up consultation and for  insertion of a central line for pressor Rx. Will also start empiric Meropenem given idiopathic nature and severity / rapid progression. Stat ID consultation. New Patient Evaluation completed.        [name] [name] 10/06/2020, 8:25 PM

## 2020-10-06 NOTE — ED Notes (Signed)
Dr. Toniann Fail aware patients BP 86/62. Pt is very lethargic, unable to carry full conversations with seeming to fall asleep.

## 2020-10-06 NOTE — Progress Notes (Signed)
Spoke w/ Dr. Ashley Royalty.  PCCM has agreed to see pt.  HR somewhat slower after medication.  Advised pt of the potential to get worse, hence having Critical Care see pt.  O2 sat around 90%--on RA, somewhat somulent on pain meds, no resp distress (pt states it hurts to take a deep breath, but she does not c/o being SOB).  Chest clear to ascultation anteriorly.  Heather Lynn, M.D. Pager [phone] If no answer or after 5 PM call [phone]

## 2020-10-06 NOTE — ED Provider Notes (Signed)
[facility]ERGENCY DEPT Provider Note: Lowella Dell, MD, FACEP  CSN: [id number] MRN: [id number] ARRIVAL: 09/22/2020 at 2128 ROOM: WA14/WA14   CHIEF COMPLAINT  Chest Pain   HISTORY OF PRESENT ILLNESS  10/06/20 12:12 AM Heather Lynn is a 19 y.o. female who developed substernal chest pain yesterday morning that has subsequently moved into her abdomen.  Her pain is now located in her abdomen diffusely.  She rates it as an 8 out of 10, worse with movement or palpation.  She is also having some low back pain as well.  She has had nausea and vomiting with this and feels dehydrated.  She has not had diarrhea.  She is equivocal about dysuria.  She has not had any vaginal bleeding or discharge.  She was noted to be tachycardic on arrival and slightly tachypneic.   Past Medical History:  Diagnosis Date  . Anxiety     Past Surgical History:  Procedure Laterality Date  . WISDOM TOOTH EXTRACTION      Family History  Problem Relation Age of Onset  . Alcohol abuse Mother   . Depression Mother   . Depression Half-Brother   . Alcohol abuse Half-Brother   . Post-traumatic stress disorder Half-Sister     Social History   Tobacco Use  . Smoking status: Former Games developer  . Smokeless tobacco: Never Used  Substance Use Topics  . Alcohol use: Not Currently  . Drug use: Not Currently    Prior to Admission medications   Medication Sig Start Date End Date Taking? Authorizing Provider  clonazePAM (KLONOPIN) 1 MG tablet Take 1 tablet (1 mg total) by mouth daily as needed for anxiety. 10/09/20 11/08/20 Yes Corie Chiquito, PMHNP  FLUoxetine (PROZAC) 20 MG capsule Take 1 capsule (20 mg total) by mouth daily. 09/28/20  Yes Corie Chiquito, PMHNP  QUEtiapine (SEROQUEL) 400 MG tablet Take 1 tablet (400 mg total) by mouth at bedtime. 09/28/20 10/28/20 Yes Corie Chiquito, PMHNP    Allergies Lamictal [lamotrigine]   REVIEW OF SYSTEMS  Negative except as noted here or in the History of Present  Illness.   PHYSICAL EXAMINATION  Initial Vital Signs Blood pressure (!) 174/139, pulse (!) 112, temperature 98.1 F (36.7 C), resp. rate (!) 21, last menstrual period 09/25/2020, SpO2 99 %.  Examination General: Well-developed, well-nourished female in no acute distress; appearance consistent with age of record HENT: normocephalic; atraumatic Eyes: pupils equal, round and reactive to light; extraocular muscles intact Neck: supple Heart: regular rate and rhythm Lungs: clear to auscultation bilaterally Abdomen: soft; nondistended; diffusely tender; bowel sounds hypoactive Extremities: No deformity; full range of motion; pulses normal Neurologic: Awake but lethargic; motor function intact in all extremities and symmetric; no facial droop Skin: Warm and dry; pale Psychiatric: Flat affect   RESULTS  Summary of this visit's results, reviewed and interpreted by myself:   EKG Interpretation  Date/Time:  Wednesday October 05 2020 21:44:47 EDT Ventricular Rate:  134 PR Interval:    QRS Duration: 82 QT Interval:  346 QTC Calculation: 517 R Axis:   70 Text Interpretation: Sinus tachycardia Low voltage, precordial leads Borderline T abnormalities, diffuse leads Prolonged QT interval Baseline wander in lead(s) II III aVR aVF V1 V3 No previous ECGs available Confirmed by Xitlally Mooneyham, Jonny Ruiz ([id number]) on 10/06/2020 12:11:48 AM      Laboratory Studies: Results for orders placed or performed during the hospital encounter of 09/27/2020 (from the past 24 hour(s))  Basic metabolic panel     Status: Abnormal   Collection Time: 09/21/2020  10:03 PM  Result Value Ref Range   Sodium 138 135 - 145 mmol/L   Potassium 3.5 3.5 - 5.1 mmol/L   Chloride 103 98 - 111 mmol/L   CO2 22 22 - 32 mmol/L   Glucose, Bld 143 (H) 70 - 99 mg/dL   BUN 13 6 - 20 mg/dL   Creatinine, Ser 1.610.75 0.44 - 1.00 mg/dL   Calcium 9.2 8.9 - 09.610.3 mg/dL   GFR, Estimated >04>60 >54>60 mL/min   Anion gap 13 5 - 15  CBC     Status: Abnormal    Collection Time: 2020-01-10 10:03 PM  Result Value Ref Range   WBC 21.8 (H) 4.0 - 10.5 K/uL   RBC 5.01 3.87 - 5.11 MIL/uL   Hemoglobin 12.9 12.0 - 15.0 g/dL   HCT 09.841.2 36 - 46 %   MCV 82.2 80.0 - 100.0 fL   MCH 25.7 (L) 26.0 - 34.0 pg   MCHC 31.3 30.0 - 36.0 g/dL   RDW 11.916.8 (H) 14.711.5 - 82.915.5 %   Platelets 457 (H) 150 - 400 K/uL   nRBC 0.0 0.0 - 0.2 %  Troponin I (High Sensitivity)     Status: None   Collection Time: 2020-01-10 10:03 PM  Result Value Ref Range   Troponin I (High Sensitivity) <2 <18 ng/L  Differential     Status: Abnormal   Collection Time: 2020-01-10 10:03 PM  Result Value Ref Range   Neutrophils Relative % 63 %   Neutro Abs 13.7 (H) 1.7 - 7.7 K/uL   Lymphocytes Relative 27 %   Lymphs Abs 6.0 (H) 0.7 - 4.0 K/uL   Monocytes Relative 6 %   Monocytes Absolute 1.3 (H) 0.1 - 1.0 K/uL   Eosinophils Relative 2 %   Eosinophils Absolute 0.4 0.0 - 0.5 K/uL   Basophils Relative 0 %   Basophils Absolute 0.1 0.0 - 0.1 K/uL   Immature Granulocytes 2 %   Abs Immature Granulocytes 0.37 (H) 0.00 - 0.07 K/uL  I-Stat beta hCG blood, ED     Status: None   Collection Time: 2020-01-10 10:11 PM  Result Value Ref Range   I-stat hCG, quantitative <5.0 <5 mIU/mL   Comment 3          Hepatic function panel     Status: Abnormal   Collection Time: 10/06/20 12:22 AM  Result Value Ref Range   Total Protein 7.6 6.5 - 8.1 g/dL   Albumin 3.3 (L) 3.5 - 5.0 g/dL   AST 46 (H) 15 - 41 U/L   ALT 55 (H) 0 - 44 U/L   Alkaline Phosphatase 79 38 - 126 U/L   Total Bilirubin 0.8 0.3 - 1.2 mg/dL   Bilirubin, Direct 0.1 0.0 - 0.2 mg/dL   Indirect Bilirubin 0.7 0.3 - 0.9 mg/dL  Lipase, blood     Status: Abnormal   Collection Time: 10/06/20 12:22 AM  Result Value Ref Range   Lipase 1,346 (H) 11 - 51 U/L  Troponin I (High Sensitivity)     Status: None   Collection Time: 10/06/20 12:39 AM  Result Value Ref Range   Troponin I (High Sensitivity) <2 <18 ng/L  Urinalysis, Routine w reflex microscopic Urine,  Clean Catch     Status: Abnormal   Collection Time: 10/06/20  2:00 AM  Result Value Ref Range   Color, Urine STRAW (A) YELLOW   APPearance CLEAR CLEAR   Specific Gravity, Urine 1.034 (H) 1.005 - 1.030   pH 5.0 5.0 - 8.0  Glucose, UA NEGATIVE NEGATIVE mg/dL   Hgb urine dipstick MODERATE (A) NEGATIVE   Bilirubin Urine NEGATIVE NEGATIVE   Ketones, ur 5 (A) NEGATIVE mg/dL   Protein, ur NEGATIVE NEGATIVE mg/dL   Nitrite NEGATIVE NEGATIVE   Leukocytes,Ua NEGATIVE NEGATIVE   RBC / HPF 11-20 0 - 5 RBC/hpf   WBC, UA 0-5 0 - 5 WBC/hpf   Bacteria, UA NONE SEEN NONE SEEN   Squamous Epithelial / LPF 0-5 0 - 5   Imaging Studies: DG Chest 2 View  Result Date: Oct 10, 2020 CLINICAL DATA:  Chest pain EXAM: CHEST - 2 VIEW COMPARISON:  None. FINDINGS: The heart size and mediastinal contours are within normal limits. Both lungs are clear. The visualized skeletal structures are unremarkable. Low lung volumes IMPRESSION: No active cardiopulmonary disease. Electronically Signed   By: Jasmine Pang M.D.   On: 10-10-2020 22:19   CT ABDOMEN PELVIS W CONTRAST  Result Date: 10/06/2020 CLINICAL DATA:  19 year old female with abdominal pain. Nausea and vomiting. EXAM: CT ABDOMEN AND PELVIS WITH CONTRAST TECHNIQUE: Multidetector CT imaging of the abdomen and pelvis was performed using the standard protocol following bolus administration of intravenous contrast. CONTRAST:  OMNIPAQUE IOHEXOL 300 MG/ML  SOLN COMPARISON:  None. FINDINGS: Lower chest: Trace bilateral pleural effusions. The visualized lung bases are otherwise clear. No intra-abdominal free air.  Small ascites. Hepatobiliary: Apparent mild fatty liver. No intrahepatic biliary ductal dilatation. The gallbladder is unremarkable. Pancreas: There is diffuse inflammatory changes of the pancreas with peripancreatic fluid and edema consistent with acute pancreatitis. No drainable fluid collection/abscess or pseudocyst. No dilatation of the main pancreatic duct.  Spleen: Normal in size without focal abnormality. Adrenals/Urinary Tract: The adrenal glands unremarkable. The kidneys, visualized ureters, and urinary bladder appear unremarkable. Stomach/Bowel: There is no bowel obstruction or active inflammation. The appendix is normal. Vascular/Lymphatic: The abdominal aorta an IVC unremarkable. The SMV, splenic vein, and main portal vein are patent. No portal venous gas. There is no adenopathy. Reproductive: The uterus is anteverted and grossly unremarkable. No adnexal masses. Other: None Musculoskeletal: No acute or significant osseous findings. IMPRESSION: 1. Acute pancreatitis. No abscess or pseudocyst. 2. Small ascites. 3. Trace bilateral pleural effusions. 4. No bowel obstruction. Normal appendix. Electronically Signed   By: Elgie Collard M.D.   On: 10/06/2020 01:37    ED COURSE and MDM  Nursing notes, initial and subsequent vitals signs, including pulse oximetry, reviewed and interpreted by myself.  Vitals:   10/06/20 0200 10/06/20 0215 10/06/20 0230 10/06/20 0245  BP: (!) 144/98 (!) 144/90 139/76 (!) 149/91  Pulse: (!) 104 (!) 101 (!) 109 (!) 116  Resp: (!) 26 (!) 25 (!) 23 (!) 25  Temp:      TempSrc:      SpO2: 97% 97% 99% 99%  Weight:       Medications  lactated ringers bolus 2,000 mL (has no administration in time range)  ondansetron (ZOFRAN-ODT) disintegrating tablet 4 mg (4 mg Oral Given 10-Oct-2020 2147)  ondansetron (ZOFRAN) injection 4 mg (4 mg Intravenous Given 10/06/20 0050)  fentaNYL (SUBLIMAZE) injection 100 mcg (100 mcg Intravenous Given 10/06/20 0050)  sodium chloride 0.9 % bolus 1,000 mL (1,000 mLs Intravenous New Bag/Given 10/06/20 0050)  iohexol (OMNIPAQUE) 300 MG/ML solution 100 mL (100 mLs Intravenous Contrast Given 10/06/20 0110)  sodium chloride (PF) 0.9 % injection (  Given by Other 10/06/20 0227)  promethazine (PHENERGAN) injection 12.5 mg (12.5 mg Intravenous Given 10/06/20 0225)  HYDROmorphone (DILAUDID) injection 1 mg (1  mg Intravenous  Given 10/06/20 0226)   2:33 AM CT shows acute pancreatitis which is consistent with the patient's clinical presentation.  Lipase is pending.  Will have patient admitted.  2:50 AM Dr. Toniann Fail to admit to hospitalist service.  Cause of pancreatitis is unclear.  No obvious gallstone impaction seen on CT.  Triglyceride level sent.   PROCEDURES  Procedures  CRITICAL CARE Performed by: Carlisle Beers Raimundo Corbit Total critical care time: 30 minutes Critical care time was exclusive of separately billable procedures and treating other patients. Critical care was necessary to treat or prevent imminent or life-threatening deterioration. Critical care was time spent personally by me on the following activities: development of treatment plan with patient and/or surrogate as well as nursing, discussions with consultants, evaluation of patient's response to treatment, examination of patient, obtaining history from patient or surrogate, ordering and performing treatments and interventions, ordering and review of laboratory studies, ordering and review of radiographic studies, pulse oximetry and re-evaluation of patient's condition.   ED DIAGNOSES     ICD-10-CM   1. Acute pancreatitis, unspecified complication status, unspecified pancreatitis type  K85.90   2. Leukocytosis, unspecified type  D72.829   3. Elevated liver transaminase level  R74.01        Munachimso Rigdon, Jonny Ruiz, MD 10/06/20 [phone]

## 2020-10-06 NOTE — ED Notes (Signed)
Dr. Kakrakandy bedside. 

## 2020-10-06 NOTE — ED Provider Notes (Signed)
Procedure note: Ultrasound Guided Peripheral IV Ultrasound guided peripheral 1.88 inch angiocath IV placement performed by me. Indications: Nursing unable to place IV. Details: The antecubital fossa and upper arm were evaluated with a multifrequency linear probe. Patent brachial veins were noted. 1 attempt was made to cannulate a vein under realtime US guidance with successful cannulation of the vein and catheter placement. There is return of non-pulsatile dark red blood. The patient tolerated the procedure well without complications. Images archived electronically.  CPT codes: 95093 and 26712    Melene Plan, DO 10/06/20 2009

## 2020-10-06 NOTE — ED Notes (Signed)
Respiratory bedside for ABG.

## 2020-10-06 NOTE — ED Notes (Signed)
Placed pt on 2L while she is sleeping

## 2020-10-06 NOTE — Progress Notes (Signed)
NAMECleveland Lynn, MRN:  [id number], DOB:  2001-05-01, LOS: 0 ADMISSION DATE:  10/02/2020, CONSULTATION DATE:  10/06/20 REFERRING MD:  Coralyn Helling, MD CHIEF COMPLAINT:  Belly pain   Brief History   19 year old who presented to the ED with severe abdominal pain found to have acute idiopathic pancreatitis.  History of present illness   Patient had onset of belly pain about 1 to 2 days ago.  Is associated with nausea with vomiting.  She points to the top of her belly.  Does endorse some radiation to the back.  No shortness of breath.  Has developed a little bit of chest pain over the last few hours.  No fever or chills.  She drinks irregularly, had 3 white claws last week.  No significant binge drinking recently.  She does not do IV drugs.  Does smoke marijuana on occasion.  No obvious medication culprits for pancreatitis on her medication list.  In the ED, she has been intermittently tachycardic.  She is demonstrated saturations in the mid to high 90s on room air.  Her blood pressure has been okay.  Her hemoglobin and hematocrit have increased despite aggressive hydration.  Leukocytosis climbed 20,000-40,000.  Lipase 1300.  No hyperbilirubinemia.  LFTs mildly elevated.  Noted to have some fatty liver on imaging.  Triglycerides only 181.  She endorses good urine output with the immense amount of IV fluid she is receiving  Past Medical History  Anxiety  Significant Hospital Events   10/21 admit, change to ICU status, start pressors  Consults:  GI  Procedures:    Significant Diagnostic Tests:   CT abd/pelvis 10/21 >> trace effusions, small ascites, fatty liver, diffuse inflammatory changes of pancreas with peripancreatic fluid and edema  Abd u/s 10/21 >> no gallstones, CBD 5 mm  Micro Data:  COVID 10/21 >> negative Blood 10/21 >>  Antimicrobials:  Meropenem 10/21 >>   Interim history/subjective:  ABG shows acidosis.  Continues to have abdominal pain and back pain.  Started on  low dose phenylephrine and getting albumin.  Feels like she is able to keep up with her breathing.    Objective   Blood pressure (!) 88/53, pulse (!) 140, temperature (!) 101.3 F (38.5 C), resp. rate (!) 32, weight 83.9 kg, last menstrual period 09/25/2020, SpO2 99 %.        Intake/Output Summary (Last 24 hours) at 10/06/2020 2151 Last data filed at 10/06/2020 2125 Gross per 24 hour  Intake 3150 ml  Output --  Net 3150 ml   Filed Weights   10/06/20 0040  Weight: 83.9 kg    Examination:  General - pale Eyes - pupils reactive ENT - no sinus tenderness, no stridor Cardiac - regular, tachycardic, no murmur Chest - equal breath sounds b/l, no wheezing or rales Abdomen - mild distention, tender in mid epigastric region, decreased bowel sounds Extremities - no cyanosis, clubbing, or edema Skin - no rashes Neuro - A & O x 3, follows commands appropriately, moves all extremities   Resolved Hospital Problem list     Assessment & Plan:   Severe acute pancreatitis. - no clear inciting event - continue IV fluids, pain control - okay to have ice chips - procalcitonin elevated >> started on meropenem - GI consulted - f/u calcium, triglycerides  SIRS/sepsis with lactic acidosis from acute pancreatitis. - continue volume resuscitation - peripheral pressors for now >> might need CVL if blood pressure remains low - f/u lactic acid level  Acute hypoxic respiratory  failure in setting of pancreatitis. - she is maintaining ventilatory status at this time - continue supplemental oxygen to keep SpO2 > 92% - monitor in ICU >> concerned she might progress to ARDS and need mechanical ventilation  AKI from [facility]. Hyperkalemia. - baseline creatinine 0.75 from 09/19/2020 - f/u BMET, monitor urine outpt - monitor need for renal replacement therapy  Hx of anxiety. - prn ativan IV - hold outpt klonopin, seroquel, prozac  Best practice:  Diet: NPO with ice chips DVT prophylaxis:  lovenox SUP: protonix Mobility: bed rest Family: updated pt's husband at bedside Disposition: ICU  Labs    CMP Latest Ref Rng & Units 10/06/2020 10/06/2020 10/06/2020  Glucose 70 - 99 mg/dL 237(S) - -  BUN 6 - 20 mg/dL 14 - -  Creatinine 2.83 - 1.00 mg/dL 1.51(V) 6.16 -  Sodium 135 - 145 mmol/L 139 - -  Potassium 3.5 - 5.1 mmol/L 6.1(H) - -  Chloride 98 - 111 mmol/L 103 - -  CO2 22 - 32 mmol/L 14(L) - -  Calcium 8.9 - 10.3 mg/dL 7.6(L) - -  Total Protein 6.5 - 8.1 g/dL 6.2(L) 7.1 7.6  Total Bilirubin 0.3 - 1.2 mg/dL 0.8 0.4 0.8  Alkaline Phos 38 - 126 U/L 61 73 79  AST 15 - 41 U/L 60(H) 47(H) 46(H)  ALT 0 - 44 U/L 41 48(H) 55(H)    CBC Latest Ref Rng & Units 10/06/2020 10/06/2020 10/01/2020  WBC 4.0 - 10.5 K/uL 49.1(H) 40.3(H) 21.8(H)  Hemoglobin 12.0 - 15.0 g/dL 07.3 71.0 62.6  Hematocrit 36 - 46 % 50.3(H) 47.4(H) 41.2  Platelets 150 - 400 K/uL 512(H) 525(H) 457(H)    ABG    Component Value Date/Time   PHART 7.091 (LL) 10/06/2020 2044   PCO2ART 39.0 10/06/2020 2044   PO2ART 124 (H) 10/06/2020 2044   HCO3 11.3 (L) 10/06/2020 2044   ACIDBASEDEF 17.6 (H) 10/06/2020 2044   O2SAT 97.2 10/06/2020 2044    CBG (last 3)  Recent Labs    10/06/20 1156 10/06/20 1830 10/06/20 1953  GLUCAP 170* 226* 295*    Critical care time: 54 minutes   Coralyn Helling, MD [facility] Pager - [phone] 10/06/2020, 10:05 PM

## 2020-10-06 NOTE — Progress Notes (Signed)
Patient's labs show further deterioration.  Chief immediate concern is potassium of 6.1.  I have discussed this with the patient's attending hospitalist, Dr. Ashley Royalty, who plans to switch her from lactated Ringer's and administer calcium/glucose.  Meanwhile, there is worsening renal function with creatinine 1.44, progressive leukocytosis with white count of 49,000, and further rise in hemoglobin despite aggressive hydration which again is a very concerning sign.  Dr. Ashley Royalty plans to update pulmonary/critical care medicine regarding the patient's status.  Unfortunately, I am not aware of any intervention from the GI tract standpoint that is likely to change the trajectory of this patient's illness.  Florencia Reasons, M.D. Pager [phone] If no answer or after 5 PM call [phone]

## 2020-10-06 NOTE — Consult Note (Addendum)
Referring Provider:  [facility] Primary Care Physician:  Assunta Found, MD Primary Gastroenterologist: None (unassigned)  Reason for Consultation: Acute pancreatitis  HPI: Heather Lynn is a 19 y.o. female with no prior history of pancreatitis presented to the emergency room last night with severe pancreatitis following a prodrome of perhaps 12 hours of vague abdominal malaise.  Lipase in the emergency room was 1346, and a CT scan showed moderate pancreatic inflammation.  Ultrasound is negative for stones or biliary ductal dilatation but does so hepatic steatosis.  Triglycerides mildly elevated at 181.   The patient has a history of depression, regular use of THC, rare use of alcohol (including 3 "white claws" last week, although that is the first alcohol she has had in a year), and a family history of autoimmune disorder in her half-sister.  Since coming into the emergency room, the patient has had a rise in hemoglobin from an admission level of 12.9 to a current level of 14.3, roughly 8 hours later, despite IV hydration.  During that time, her white count has gone up from 22,000 to 40,000.   Past Medical History:  Diagnosis Date  . Anxiety     Past Surgical History:  Procedure Laterality Date  . WISDOM TOOTH EXTRACTION      Prior to Admission medications   Medication Sig Start Date End Date Taking? Authorizing Provider  clonazePAM (KLONOPIN) 1 MG tablet Take 1 tablet (1 mg total) by mouth daily as needed for anxiety. 10/09/20 11/08/20 Yes Corie Chiquito, PMHNP  FLUoxetine (PROZAC) 20 MG capsule Take 1 capsule (20 mg total) by mouth daily. 09/28/20  Yes Corie Chiquito, PMHNP  QUEtiapine (SEROQUEL) 400 MG tablet Take 1 tablet (400 mg total) by mouth at bedtime. 09/28/20 10/28/20 Yes Corie Chiquito, PMHNP    Current Facility-Administered Medications  Medication Dose Route Frequency Provider Last Rate Last Admin  . acetaminophen (TYLENOL) tablet 650 mg  650 mg Oral Q6H  PRN Eduard Clos, MD       Or  . acetaminophen (TYLENOL) suppository 650 mg  650 mg Rectal Q6H PRN Eduard Clos, MD      . clonazePAM Scarlette Calico) tablet 1 mg  1 mg Oral BID Alwyn Ren, MD      . enoxaparin (LOVENOX) injection 40 mg  40 mg Subcutaneous Q24H Eduard Clos, MD   40 mg at 10/06/20 0802  . FLUoxetine (PROZAC) capsule 20 mg  20 mg Oral Daily Alwyn Ren, MD      . HYDROmorphone (DILAUDID) injection 1 mg  1 mg Intravenous Q2H PRN Alwyn Ren, MD   1 mg at 10/06/20 0858  . lactated ringers infusion   Intravenous Continuous Bernette Redbird, MD 500 mL/hr at 10/06/20 1040 Rate Change at 10/06/20 1040  . LORazepam (ATIVAN) injection 1 mg  1 mg Intravenous Q4H PRN Alwyn Ren, MD   1 mg at 10/06/20 1018  . metoprolol tartrate (LOPRESSOR) injection 5 mg  5 mg Intravenous Q6H PRN Alwyn Ren, MD   5 mg at 10/06/20 1047  . morphine 2 MG/ML injection 2 mg  2 mg Intravenous Q2H PRN Eduard Clos, MD   2 mg at 10/06/20 0801  . ondansetron (ZOFRAN) injection 4 mg  4 mg Intravenous Q6H PRN Eduard Clos, MD   4 mg at 10/06/20 0801  . piperacillin-tazobactam (ZOSYN) IVPB 3.375 g  3.375 g Intravenous Q8H Eduard Clos, MD      . QUEtiapine (SEROQUEL) tablet 400 mg  400 mg Oral QHS Alwyn Ren, MD       Current Outpatient Medications  Medication Sig Dispense Refill  . [START ON 10/09/2020] clonazePAM (KLONOPIN) 1 MG tablet Take 1 tablet (1 mg total) by mouth daily as needed for anxiety. 30 tablet 1  . FLUoxetine (PROZAC) 20 MG capsule Take 1 capsule (20 mg total) by mouth daily. 30 capsule 1  . QUEtiapine (SEROQUEL) 400 MG tablet Take 1 tablet (400 mg total) by mouth at bedtime. 30 tablet 1    Allergies as of 09/27/2020 - Review Complete 10/12/2020  Allergen Reaction Noted  . Lamictal [lamotrigine]  05/09/2020    Family History  Problem Relation Age of Onset  . Alcohol abuse Mother   . Depression  Mother   . Depression Half-Brother   . Alcohol abuse Half-Brother   . Post-traumatic stress disorder Half-Sister     Social History   Socioeconomic History  . Marital status: Married    Spouse name: Not on file  . Number of children: Not on file  . Years of education: Not on file  . Highest education level: Not on file  Occupational History  . Not on file  Tobacco Use  . Smoking status: Former Games developer  . Smokeless tobacco: Never Used  Substance and Sexual Activity  . Alcohol use: Not Currently  . Drug use: Not Currently  . Sexual activity: Not on file  Other Topics Concern  . Not on file  Social History Narrative  . Not on file   Social Determinants of Health   Financial Resource Strain:   . Difficulty of Paying Living Expenses: Not on file  Food Insecurity:   . Worried About Programme researcher, broadcasting/film/video in the Last Year: Not on file  . Ran Out of Food in the Last Year: Not on file  Transportation Needs:   . Lack of Transportation (Medical): Not on file  . Lack of Transportation (Non-Medical): Not on file  Physical Activity:   . Days of Exercise per Week: Not on file  . Minutes of Exercise per Session: Not on file  Stress:   . Feeling of Stress : Not on file  Social Connections:   . Frequency of Communication with Friends and Family: Not on file  . Frequency of Social Gatherings with Friends and Family: Not on file  . Attends Religious Services: Not on file  . Active Member of Clubs or Organizations: Not on file  . Attends Banker Meetings: Not on file  . Marital Status: Not on file  Intimate Partner Violence:   . Fear of Current or Ex-Partner: Not on file  . Emotionally Abused: Not on file  . Physically Abused: Not on file  . Sexually Abused: Not on file    Review of Systems: Currently, patient's pain level is roughly 3 on a level of 10, following pain medication.  She is comfortable as long as she lies still, but gets pain if she moves around.  Physical  Exam: Vital signs in last 24 hours: Temp:  [98.1 F (36.7 C)] 98.1 F (36.7 C) (10/20 2352) Pulse Rate:  [95-174] 128 (10/21 1051) Resp:  [16-34] 18 (10/21 1007) BP: (120-174)/(66-139) 141/92 (10/21 1030) SpO2:  [92 %-100 %] 96 % (10/21 1051) Weight:  [83.9 kg] 83.9 kg (10/21 0040)   This is a pleasant, somewhat overweight Caucasian female, appears quite pale, no distress, somewhat somnolent on pain medication.  Sclerae anicteric, conjunctivae not pale.  Chest clear anteriorly.  Heart  has rapid rate, approximately 170.  No murmur appreciated.  Abdomen somewhat adipose, bowel sounds quiet, no obvious mass-effect, no overt tenderness (following pain medication).  No evident focal neurologic deficits, cognitively intact, no peripheral edema.  Intake/Output from previous day: 10/20 0701 - 10/21 0700 In: 1000 [IV Piggyback:1000] Out: -  Intake/Output this shift: No intake/output data recorded.  Lab Results: Recent Labs    09/22/2020 2203 10/06/20 0744  WBC 21.8* 40.3*  HGB 12.9 14.3  HCT 41.2 47.4*  PLT 457* 525*   BMET Recent Labs    10/12/2020 2203 10/06/20 0744  NA 138  --   K 3.5  --   CL 103  --   CO2 22  --   GLUCOSE 143*  --   BUN 13  --   CREATININE 0.75 0.78  CALCIUM 9.2  --    LFT Recent Labs    10/06/20 0744  PROT 7.1  ALBUMIN 3.0*  AST 47*  ALT 48*  ALKPHOS 73  BILITOT 0.4  BILIDIR 0.1  IBILI 0.3   PT/INR No results for input(s): LABPROT, INR in the last 72 hours.  Studies/Results: DG Chest 2 View  Result Date: 09/30/2020 CLINICAL DATA:  Chest pain EXAM: CHEST - 2 VIEW COMPARISON:  None. FINDINGS: The heart size and mediastinal contours are within normal limits. Both lungs are clear. The visualized skeletal structures are unremarkable. Low lung volumes IMPRESSION: No active cardiopulmonary disease. Electronically Signed   By: Jasmine PangKim  Fujinaga M.D.   On: 10/14/2020 22:19   US Abdomen Complete  Result Date: 10/06/2020 CLINICAL DATA:  Abdominal pain.  EXAM: ABDOMEN ULTRASOUND COMPLETE COMPARISON:  CT abdomen and pelvis 10/06/2020 FINDINGS: Gallbladder: No gallstones or wall thickening visualized. No sonographic Murphy sign noted by sonographer. Common bile duct: Diameter: 5 mm, normal Liver: Diffusely increased parenchymal echotexture. Consistent with fatty infiltration. No focal lesions identified. Portal vein is patent on color Doppler imaging with normal direction of blood flow towards the liver. IVC: Limited visualization.  Visualized segments appear normal. Pancreas: Enlarged hypoechoic appearance to the pancreas with peripancreatic fluid consistent with acute pancreatitis, better seen on previous CT. No pancreatic ductal dilatation identified. Spleen: Size and appearance within normal limits. Right Kidney: Length: 9.3 cm. Echogenicity within normal limits. No mass or hydronephrosis visualized. Left Kidney: Length: 10.3 cm. Echogenicity within normal limits. No mass or hydronephrosis visualized. Abdominal aorta: Visualized portions demonstrate normal caliber. Other findings: Small amount of free fluid around the liver and spleen. IMPRESSION: 1. No evidence of cholelithiasis or cholecystitis. 2. Enlarged hypoechoic appearance to the pancreas consistent with acute pancreatitis as seen on previous CT. Small amount of free fluid around the liver and spleen. 3. Diffuse fatty infiltration of the liver. Electronically Signed   By: Burman NievesWilliam  Stevens M.D.   On: 10/06/2020 05:53   CT ABDOMEN PELVIS W CONTRAST  Result Date: 10/06/2020 CLINICAL DATA:  19 year old female with abdominal pain. Nausea and vomiting. EXAM: CT ABDOMEN AND PELVIS WITH CONTRAST TECHNIQUE: Multidetector CT imaging of the abdomen and pelvis was performed using the standard protocol following bolus administration of intravenous contrast. CONTRAST:  100mL OMNIPAQUE IOHEXOL 300 MG/ML  SOLN COMPARISON:  None. FINDINGS: Lower chest: Trace bilateral pleural effusions. The visualized lung bases are  otherwise clear. No intra-abdominal free air.  Small ascites. Hepatobiliary: Apparent mild fatty liver. No intrahepatic biliary ductal dilatation. The gallbladder is unremarkable. Pancreas: There is diffuse inflammatory changes of the pancreas with peripancreatic fluid and edema consistent with acute pancreatitis. No drainable fluid collection/abscess or  pseudocyst. No dilatation of the main pancreatic duct. Spleen: Normal in size without focal abnormality. Adrenals/Urinary Tract: The adrenal glands unremarkable. The kidneys, visualized ureters, and urinary bladder appear unremarkable. Stomach/Bowel: There is no bowel obstruction or active inflammation. The appendix is normal. Vascular/Lymphatic: The abdominal aorta an IVC unremarkable. The SMV, splenic vein, and main portal vein are patent. No portal venous gas. There is no adenopathy. Reproductive: The uterus is anteverted and grossly unremarkable. No adnexal masses. Other: None Musculoskeletal: No acute or significant osseous findings. IMPRESSION: 1. Acute pancreatitis. No abscess or pseudocyst. 2. Small ascites. 3. Trace bilateral pleural effusions. 4. No bowel obstruction. Normal appendix. Electronically Signed   By: Elgie Collard M.D.   On: 10/06/2020 01:37    Impression: Clinically moderately severe acute pancreatitis of unclear cause.  I wonder about autoimmune etiology, since the most common culprits (alcohol, gallstones, hypertriglyceridemia, pancreatic neoplasia) do not appear to be in the picture.  I am concerned about the fact that the patient's hemoglobin has gone up since presentation, because that is a bad prognostic sign, as is the patient's extreme leukocytosis and tachycardia.  Plan: 1.  Aggressive hydration 2.  Consider critical care consultation   LOS: 0 days   Katy Fitch Broady Lafoy  10/06/2020, 11:00 AM   Pager [phone] If no answer or after 5 PM call [phone]

## 2020-10-06 NOTE — ED Notes (Signed)
Dr. Craige Cotta bedside. MD aware patients temp is 101.5

## 2020-10-07 ENCOUNTER — Other Ambulatory Visit: Payer: Self-pay

## 2020-10-07 ENCOUNTER — Inpatient Hospital Stay (HOSPITAL_COMMUNITY): Payer: BC Managed Care – PPO

## 2020-10-07 DIAGNOSIS — E875 Hyperkalemia: Secondary | ICD-10-CM | POA: Diagnosis not present

## 2020-10-07 DIAGNOSIS — R9431 Abnormal electrocardiogram [ECG] [EKG]: Secondary | ICD-10-CM | POA: Diagnosis not present

## 2020-10-07 DIAGNOSIS — J9600 Acute respiratory failure, unspecified whether with hypoxia or hypercapnia: Secondary | ICD-10-CM

## 2020-10-07 DIAGNOSIS — N179 Acute kidney failure, unspecified: Secondary | ICD-10-CM

## 2020-10-07 DIAGNOSIS — R6521 Severe sepsis with septic shock: Secondary | ICD-10-CM

## 2020-10-07 DIAGNOSIS — K8500 Idiopathic acute pancreatitis without necrosis or infection: Secondary | ICD-10-CM | POA: Diagnosis not present

## 2020-10-07 DIAGNOSIS — A419 Sepsis, unspecified organism: Secondary | ICD-10-CM

## 2020-10-07 LAB — COMPREHENSIVE METABOLIC PANEL
ALT: 638 U/L — ABNORMAL HIGH (ref 0–44)
ALT: 1471 U/L — ABNORMAL HIGH (ref 0–44)
AST: 1354 U/L — ABNORMAL HIGH (ref 15–41)
AST: 3991 U/L — ABNORMAL HIGH (ref 15–41)
Albumin: 3.8 g/dL (ref 3.5–5.0)
Albumin: 2.7 g/dL — ABNORMAL LOW (ref 3.5–5.0)
Alkaline Phosphatase: 32 U/L — ABNORMAL LOW (ref 38–126)
Alkaline Phosphatase: 47 U/L (ref 38–126)
Anion gap: 17 — ABNORMAL HIGH (ref 5–15)
Anion gap: 25 — ABNORMAL HIGH (ref 5–15)
BUN: 23 mg/dL — ABNORMAL HIGH (ref 6–20)
BUN: 22 mg/dL — ABNORMAL HIGH (ref 6–20)
CO2: 13 mmol/L — ABNORMAL LOW (ref 22–32)
CO2: 20 mmol/L — ABNORMAL LOW (ref 22–32)
Calcium: 6 mg/dL — CL (ref 8.9–10.3)
Calcium: 5 mg/dL — CL (ref 8.9–10.3)
Chloride: 108 mmol/L (ref 98–111)
Chloride: 94 mmol/L — ABNORMAL LOW (ref 98–111)
Creatinine, Ser: 3.02 mg/dL — ABNORMAL HIGH (ref 0.44–1.00)
Creatinine, Ser: 2.6 mg/dL — ABNORMAL HIGH (ref 0.44–1.00)
GFR, Estimated: 22 mL/min — ABNORMAL LOW (ref >60)
GFR, Estimated: 26 mL/min — ABNORMAL LOW (ref >60)
Glucose, Bld: 182 mg/dL — ABNORMAL HIGH (ref 70–99)
Glucose, Bld: 225 mg/dL — ABNORMAL HIGH (ref 70–99)
Potassium: 7.2 mmol/L (ref 3.5–5.1)
Potassium: 3.9 mmol/L (ref 3.5–5.1)
Sodium: 138 mmol/L (ref 135–145)
Sodium: 139 mmol/L (ref 135–145)
Total Bilirubin: 1.6 mg/dL — ABNORMAL HIGH (ref 0.3–1.2)
Total Bilirubin: 1.4 mg/dL — ABNORMAL HIGH (ref 0.3–1.2)
Total Protein: 5.9 g/dL — ABNORMAL LOW (ref 6.5–8.1)
Total Protein: 4.9 g/dL — ABNORMAL LOW (ref 6.5–8.1)

## 2020-10-07 LAB — BLOOD GAS, ARTERIAL
Acid-base deficit: 13.6 mmol/L — ABNORMAL HIGH (ref 0.0–2.0)
Acid-base deficit: 11.9 mmol/L — ABNORMAL HIGH (ref 0.0–2.0)
Acid-base deficit: 5.9 mmol/L — ABNORMAL HIGH (ref 0.0–2.0)
Bicarbonate: 13.8 mmol/L — ABNORMAL LOW (ref 20.0–28.0)
Bicarbonate: 15.4 mmol/L — ABNORMAL LOW (ref 20.0–28.0)
Bicarbonate: 20.8 mmol/L (ref 20.0–28.0)
Drawn by: 225631
Drawn by: 560031
FIO2: 100
FIO2: 100
FIO2: 60
MECHVT: 380 mL
MECHVT: 380 mL
O2 Saturation: 96.8 %
O2 Saturation: 97.4 %
O2 Saturation: 96.9 %
PEEP: 10 cmH2O
PEEP: 8 cmH2O
Patient temperature: 101.1
Patient temperature: 38.4
Patient temperature: 98.8
RATE: 28 resp/min
RATE: 30 resp/min
pCO2 arterial: 43.2 mmHg (ref 32.0–48.0)
pCO2 arterial: 46.2 mmHg (ref 32.0–48.0)
pCO2 arterial: 49.6 mmHg — ABNORMAL HIGH (ref 32.0–48.0)
pH, Arterial: 7.142 — CL (ref 7.350–7.450)
pH, Arterial: 7.159 — CL (ref 7.350–7.450)
pH, Arterial: 7.246 — ABNORMAL LOW (ref 7.350–7.450)
pO2, Arterial: 120 mmHg — ABNORMAL HIGH (ref 83.0–108.0)
pO2, Arterial: 124 mmHg — ABNORMAL HIGH (ref 83.0–108.0)
pO2, Arterial: 105 mmHg (ref 83.0–108.0)

## 2020-10-07 LAB — GLUCOSE, CAPILLARY
Glucose-Capillary: 148 mg/dL — ABNORMAL HIGH (ref 70–99)
Glucose-Capillary: 210 mg/dL — ABNORMAL HIGH (ref 70–99)
Glucose-Capillary: 224 mg/dL — ABNORMAL HIGH (ref 70–99)
Glucose-Capillary: 226 mg/dL — ABNORMAL HIGH (ref 70–99)
Glucose-Capillary: 260 mg/dL — ABNORMAL HIGH (ref 70–99)
Glucose-Capillary: 264 mg/dL — ABNORMAL HIGH (ref 70–99)
Glucose-Capillary: 292 mg/dL — ABNORMAL HIGH (ref 70–99)
Glucose-Capillary: 77 mg/dL (ref 70–99)
Glucose-Capillary: 85 mg/dL (ref 70–99)

## 2020-10-07 LAB — LIPASE, BLOOD: Lipase: 4426 U/L — ABNORMAL HIGH (ref 11–51)

## 2020-10-07 LAB — HEPATIC FUNCTION PANEL
ALT: 922 U/L — ABNORMAL HIGH (ref 0–44)
AST: 2382 U/L — ABNORMAL HIGH (ref 15–41)
Albumin: 2.9 g/dL — ABNORMAL LOW (ref 3.5–5.0)
Alkaline Phosphatase: 29 U/L — ABNORMAL LOW (ref 38–126)
Bilirubin, Direct: 0.6 mg/dL — ABNORMAL HIGH (ref 0.0–0.2)
Indirect Bilirubin: 0.7 mg/dL (ref 0.3–0.9)
Total Bilirubin: 1.3 mg/dL — ABNORMAL HIGH (ref 0.3–1.2)
Total Protein: 5 g/dL — ABNORMAL LOW (ref 6.5–8.1)

## 2020-10-07 LAB — RENAL FUNCTION PANEL
Albumin: 3.5 g/dL (ref 3.5–5.0)
Albumin: 3 g/dL — ABNORMAL LOW (ref 3.5–5.0)
Albumin: 2.8 g/dL — ABNORMAL LOW (ref 3.5–5.0)
Anion gap: 21 — ABNORMAL HIGH (ref 5–15)
Anion gap: 11 (ref 5–15)
Anion gap: 22 — ABNORMAL HIGH (ref 5–15)
BUN: 24 mg/dL — ABNORMAL HIGH (ref 6–20)
BUN: 29 mg/dL — ABNORMAL HIGH (ref 6–20)
BUN: 22 mg/dL — ABNORMAL HIGH (ref 6–20)
CO2: 10 mmol/L — ABNORMAL LOW (ref 22–32)
CO2: 16 mmol/L — ABNORMAL LOW (ref 22–32)
CO2: 21 mmol/L — ABNORMAL LOW (ref 22–32)
Calcium: 6.1 mg/dL — CL (ref 8.9–10.3)
Calcium: 5.8 mg/dL — CL (ref 8.9–10.3)
Calcium: 4.7 mg/dL — CL (ref 8.9–10.3)
Chloride: 109 mmol/L (ref 98–111)
Chloride: 108 mmol/L (ref 98–111)
Chloride: 94 mmol/L — ABNORMAL LOW (ref 98–111)
Creatinine, Ser: 3.04 mg/dL — ABNORMAL HIGH (ref 0.44–1.00)
Creatinine, Ser: 3.65 mg/dL — ABNORMAL HIGH (ref 0.44–1.00)
Creatinine, Ser: 2.57 mg/dL — ABNORMAL HIGH (ref 0.44–1.00)
GFR, Estimated: 22 mL/min — ABNORMAL LOW (ref >60)
GFR, Estimated: 17 mL/min — ABNORMAL LOW (ref >60)
GFR, Estimated: 27 mL/min — ABNORMAL LOW (ref >60)
Glucose, Bld: 176 mg/dL — ABNORMAL HIGH (ref 70–99)
Glucose, Bld: 276 mg/dL — ABNORMAL HIGH (ref 70–99)
Glucose, Bld: 223 mg/dL — ABNORMAL HIGH (ref 70–99)
Phosphorus: 4.6 mg/dL (ref 2.5–4.6)
Phosphorus: 4.2 mg/dL (ref 2.5–4.6)
Phosphorus: 5.2 mg/dL — ABNORMAL HIGH (ref 2.5–4.6)
Potassium: 7.2 mmol/L (ref 3.5–5.1)
Potassium: 7.2 mmol/L (ref 3.5–5.1)
Potassium: 3.9 mmol/L (ref 3.5–5.1)
Sodium: 140 mmol/L (ref 135–145)
Sodium: 135 mmol/L (ref 135–145)
Sodium: 137 mmol/L (ref 135–145)

## 2020-10-07 LAB — POCT I-STAT 7, (LYTES, BLD GAS, ICA,H+H)
Acid-base deficit: 14 mmol/L — ABNORMAL HIGH (ref 0.0–2.0)
Bicarbonate: 12.9 mmol/L — ABNORMAL LOW (ref 20.0–28.0)
Calcium, Ion: 0.78 mmol/L — CL (ref 1.15–1.40)
HCT: 30 % — ABNORMAL LOW (ref 36.0–46.0)
Hemoglobin: 10.2 g/dL — ABNORMAL LOW (ref 12.0–15.0)
O2 Saturation: 89 %
Patient temperature: 100.5
Potassium: 6.6 mmol/L (ref 3.5–5.1)
Sodium: 138 mmol/L (ref 135–145)
TCO2: 14 mmol/L — ABNORMAL LOW (ref 22–32)
pCO2 arterial: 34.7 mmHg (ref 32.0–48.0)
pH, Arterial: 7.185 — CL (ref 7.350–7.450)
pO2, Arterial: 72 mmHg — ABNORMAL LOW (ref 83.0–108.0)

## 2020-10-07 LAB — BASIC METABOLIC PANEL
Anion gap: 17 — ABNORMAL HIGH (ref 5–15)
BUN: 25 mg/dL — ABNORMAL HIGH (ref 6–20)
CO2: 14 mmol/L — ABNORMAL LOW (ref 22–32)
Calcium: 5.9 mg/dL — CL (ref 8.9–10.3)
Chloride: 108 mmol/L (ref 98–111)
Creatinine, Ser: 3.09 mg/dL — ABNORMAL HIGH (ref 0.44–1.00)
GFR, Estimated: 21 mL/min — ABNORMAL LOW (ref >60)
Glucose, Bld: 184 mg/dL — ABNORMAL HIGH (ref 70–99)
Potassium: 7.4 mmol/L (ref 3.5–5.1)
Sodium: 139 mmol/L (ref 135–145)

## 2020-10-07 LAB — TECHNOLOGIST SMEAR REVIEW
Plt Morphology: ADEQUATE
WBC Morphology: INCREASED

## 2020-10-07 LAB — CBC
HCT: 34.7 % — ABNORMAL LOW (ref 36.0–46.0)
HCT: 30.4 % — ABNORMAL LOW (ref 36.0–46.0)
Hemoglobin: 10.1 g/dL — ABNORMAL LOW (ref 12.0–15.0)
Hemoglobin: 9.4 g/dL — ABNORMAL LOW (ref 12.0–15.0)
MCH: 25.6 pg — ABNORMAL LOW (ref 26.0–34.0)
MCH: 26.6 pg (ref 26.0–34.0)
MCHC: 29.1 g/dL — ABNORMAL LOW (ref 30.0–36.0)
MCHC: 30.9 g/dL (ref 30.0–36.0)
MCV: 87.8 fL (ref 80.0–100.0)
MCV: 85.9 fL (ref 80.0–100.0)
Platelets: 296 10*3/uL (ref 150–400)
Platelets: 198 10*3/uL (ref 150–400)
RBC: 3.95 MIL/uL (ref 3.87–5.11)
RBC: 3.54 MIL/uL — ABNORMAL LOW (ref 3.87–5.11)
RDW: 16.9 % — ABNORMAL HIGH (ref 11.5–15.5)
RDW: 17.2 % — ABNORMAL HIGH (ref 11.5–15.5)
WBC: 40.5 10*3/uL — ABNORMAL HIGH (ref 4.0–10.5)
WBC: 32.6 10*3/uL — ABNORMAL HIGH (ref 4.0–10.5)
nRBC: 0.3 % — ABNORMAL HIGH (ref 0.0–0.2)
nRBC: 0.3 % — ABNORMAL HIGH (ref 0.0–0.2)

## 2020-10-07 LAB — PROCALCITONIN: Procalcitonin: 7.53 ng/mL

## 2020-10-07 LAB — HEPATITIS B CORE ANTIBODY, TOTAL: Hep B Core Total Ab: NONREACTIVE

## 2020-10-07 LAB — HEPATITIS A ANTIBODY, TOTAL: hep A Total Ab: REACTIVE — AB

## 2020-10-07 LAB — NA AND K (SODIUM & POTASSIUM), RAND UR
Potassium Urine: 58 mmol/L
Sodium, Ur: 64 mmol/L

## 2020-10-07 LAB — ECHOCARDIOGRAM COMPLETE
Area-P 1/2: 1.36 cm2
Height: 64.016 in
S' Lateral: 2.3 cm
Weight: 2959.46 oz

## 2020-10-07 LAB — CORTISOL: Cortisol, Plasma: 68.4 ug/dL

## 2020-10-07 LAB — PHOSPHORUS: Phosphorus: 4.8 mg/dL — ABNORMAL HIGH (ref 2.5–4.6)

## 2020-10-07 LAB — POTASSIUM
Potassium: 7.2 mmol/L (ref 3.5–5.1)
Potassium: 3.9 mmol/L (ref 3.5–5.1)
Potassium: 3.3 mmol/L — ABNORMAL LOW (ref 3.5–5.1)
Potassium: 3.1 mmol/L — ABNORMAL LOW (ref 3.5–5.1)

## 2020-10-07 LAB — MAGNESIUM
Magnesium: 1.7 mg/dL (ref 1.7–2.4)
Magnesium: 1.5 mg/dL — ABNORMAL LOW (ref 1.7–2.4)

## 2020-10-07 LAB — HEPATITIS C ANTIBODY: HCV Ab: NONREACTIVE

## 2020-10-07 LAB — HEPATITIS B SURFACE ANTIGEN: Hepatitis B Surface Ag: NONREACTIVE

## 2020-10-07 LAB — HEPATITIS B SURFACE ANTIBODY,QUALITATIVE: Hep B S Ab: NONREACTIVE

## 2020-10-07 LAB — TRIGLYCERIDES: Triglycerides: 272 mg/dL — ABNORMAL HIGH (ref <150)

## 2020-10-07 LAB — MRSA PCR SCREENING: MRSA by PCR: NEGATIVE

## 2020-10-07 LAB — IGG 4: IgG, Subclass 4: 54 mg/dL (ref 2–96)

## 2020-10-07 MED ORDER — ROCURONIUM BROMIDE 10 MG/ML (PF) SYRINGE
PREFILLED_SYRINGE | INTRAVENOUS | Status: AC
  Administered 2020-10-07: 70 mg
  Filled 2020-10-07: qty 10

## 2020-10-07 MED ORDER — VITAL HIGH PROTEIN PO LIQD
1000.0000 mL | ORAL | Status: DC
  Administered 2020-10-08: 1000 mL

## 2020-10-07 MED ORDER — LACTATED RINGERS IV BOLUS
1000.0000 mL | Freq: Once | INTRAVENOUS | Status: AC
  Administered 2020-10-07: 1000 mL via INTRAVENOUS

## 2020-10-07 MED ORDER — INSULIN ASPART 100 UNIT/ML IV SOLN
5.0000 [IU] | Freq: Once | INTRAVENOUS | Status: AC
  Administered 2020-10-07: 5 [IU] via INTRAVENOUS

## 2020-10-07 MED ORDER — ORAL CARE MOUTH RINSE
15.0000 mL | OROMUCOSAL | Status: DC
  Administered 2020-10-07 – 2020-10-08 (×15): 15 mL via OROMUCOSAL

## 2020-10-07 MED ORDER — VASOPRESSIN 20 UNITS/100 ML INFUSION FOR SHOCK
0.0300 [IU]/min | INTRAVENOUS | Status: DC
  Administered 2020-10-07 – 2020-10-08 (×4): 0.03 [IU]/min via INTRAVENOUS
  Filled 2020-10-07 (×5): qty 100

## 2020-10-07 MED ORDER — MAGNESIUM SULFATE 2 GM/50ML IV SOLN
2.0000 g | Freq: Once | INTRAVENOUS | Status: AC
  Administered 2020-10-07: 2 g via INTRAVENOUS
  Filled 2020-10-07: qty 50

## 2020-10-07 MED ORDER — SODIUM BICARBONATE 8.4 % IV SOLN
50.0000 meq | Freq: Once | INTRAVENOUS | Status: AC
  Administered 2020-10-07: 50 meq via INTRAVENOUS
  Filled 2020-10-07: qty 50

## 2020-10-07 MED ORDER — FENTANYL BOLUS VIA INFUSION
50.0000 ug | INTRAVENOUS | Status: DC | PRN
  Administered 2020-10-07: 50 ug via INTRAVENOUS
  Filled 2020-10-07: qty 50

## 2020-10-07 MED ORDER — FENTANYL CITRATE (PF) 100 MCG/2ML IJ SOLN
INTRAMUSCULAR | Status: AC
  Administered 2020-10-07: 100 ug
  Filled 2020-10-07: qty 2

## 2020-10-07 MED ORDER — NOREPINEPHRINE 16 MG/250ML-% IV SOLN
0.0000 ug/min | INTRAVENOUS | Status: DC
  Administered 2020-10-07: 6 ug/min via INTRAVENOUS
  Administered 2020-10-07: 38 ug/min via INTRAVENOUS
  Administered 2020-10-08 (×2): 40 ug/min via INTRAVENOUS
  Filled 2020-10-07 (×5): qty 250

## 2020-10-07 MED ORDER — ETOMIDATE 2 MG/ML IV SOLN
INTRAVENOUS | Status: AC
  Filled 2020-10-07: qty 20

## 2020-10-07 MED ORDER — MIDAZOLAM HCL 2 MG/2ML IJ SOLN
INTRAMUSCULAR | Status: AC
  Administered 2020-10-07: 4 mg
  Filled 2020-10-07: qty 4

## 2020-10-07 MED ORDER — HEPARIN SODIUM (PORCINE) 5000 UNIT/ML IJ SOLN
5000.0000 [IU] | Freq: Three times a day (TID) | INTRAMUSCULAR | Status: DC
  Administered 2020-10-07 (×3): 5000 [IU] via SUBCUTANEOUS
  Filled 2020-10-07 (×2): qty 1

## 2020-10-07 MED ORDER — INSULIN ASPART 100 UNIT/ML IV SOLN
10.0000 [IU] | Freq: Once | INTRAVENOUS | Status: AC
  Administered 2020-10-07: 10 [IU] via INTRAVENOUS

## 2020-10-07 MED ORDER — SODIUM CHLORIDE 0.9 % IV SOLN
4.0000 g | Freq: Once | INTRAVENOUS | Status: AC
  Administered 2020-10-07: 4 g via INTRAVENOUS
  Filled 2020-10-07: qty 40

## 2020-10-07 MED ORDER — FENTANYL 2500MCG IN NS 250ML (10MCG/ML) PREMIX INFUSION
50.0000 ug/h | INTRAVENOUS | Status: DC
  Administered 2020-10-07: 100 ug/h via INTRAVENOUS
  Administered 2020-10-07: 50 ug/h via INTRAVENOUS
  Filled 2020-10-07 (×2): qty 250

## 2020-10-07 MED ORDER — SODIUM CHLORIDE 0.9 % IV SOLN
1.0000 g | Freq: Three times a day (TID) | INTRAVENOUS | Status: DC
  Administered 2020-10-07: 1 g via INTRAVENOUS
  Filled 2020-10-07 (×2): qty 1

## 2020-10-07 MED ORDER — DEXTROSE 50 % IV SOLN
1.0000 | Freq: Once | INTRAVENOUS | Status: AC
  Administered 2020-10-07: 50 mL via INTRAVENOUS
  Filled 2020-10-07: qty 50

## 2020-10-07 MED ORDER — DOXYCYCLINE CALCIUM 50 MG/5ML PO SYRP
100.0000 mg | ORAL_SOLUTION | Freq: Two times a day (BID) | ORAL | Status: DC
  Administered 2020-10-07: 100 mg
  Filled 2020-10-07 (×2): qty 10

## 2020-10-07 MED ORDER — CALCIUM GLUCONATE-NACL 2-0.675 GM/100ML-% IV SOLN
2.0000 g | Freq: Once | INTRAVENOUS | Status: AC
  Administered 2020-10-07: 2000 mg via INTRAVENOUS
  Filled 2020-10-07: qty 100

## 2020-10-07 MED ORDER — SODIUM CHLORIDE 0.9 % FOR CRRT: INTRAVENOUS_CENTRAL | Status: DC | PRN

## 2020-10-07 MED ORDER — MIDAZOLAM BOLUS VIA INFUSION
1.0000 mg | INTRAVENOUS | Status: DC | PRN
  Filled 2020-10-07: qty 2

## 2020-10-07 MED ORDER — PRISMASOL BGK 4/2.5 32-4-2.5 MEQ/L IV SOLN: INTRAVENOUS | Status: DC

## 2020-10-07 MED ORDER — INSULIN ASPART 100 UNIT/ML ~~LOC~~ SOLN
0.0000 [IU] | SUBCUTANEOUS | Status: DC
  Administered 2020-10-07: 8 [IU] via SUBCUTANEOUS
  Administered 2020-10-07: 2 [IU] via SUBCUTANEOUS
  Administered 2020-10-07: 5 [IU] via SUBCUTANEOUS

## 2020-10-07 MED ORDER — PIPERACILLIN-TAZOBACTAM 3.375 G IVPB: 3.3750 g | Freq: Three times a day (TID) | INTRAVENOUS | Status: DC

## 2020-10-07 MED ORDER — DOCUSATE SODIUM 50 MG/5ML PO LIQD: 100.0000 mg | Freq: Two times a day (BID) | ORAL | Status: DC | PRN

## 2020-10-07 MED ORDER — MIDAZOLAM 50MG/50ML (1MG/ML) PREMIX INFUSION
0.0000 mg/h | INTRAVENOUS | Status: DC
  Administered 2020-10-07: 2 mg/h via INTRAVENOUS
  Administered 2020-10-07: 4 mg/h via INTRAVENOUS
  Administered 2020-10-08: 0.5 mg/h via INTRAVENOUS
  Filled 2020-10-07 (×3): qty 50

## 2020-10-07 MED ORDER — SODIUM POLYSTYRENE SULFONATE 15 GM/60ML PO SUSP
30.0000 g | Freq: Once | ORAL | Status: AC
  Administered 2020-10-07: 30 g via RECTAL
  Filled 2020-10-07: qty 120

## 2020-10-07 MED ORDER — PRISMASOL BGK 0/2.5 32-2.5 MEQ/L IV SOLN: INTRAVENOUS | Status: DC

## 2020-10-07 MED ORDER — CALCIUM GLUCONATE-NACL 1-0.675 GM/50ML-% IV SOLN
1.0000 g | INTRAVENOUS | Status: AC
  Administered 2020-10-07: 1000 mg via INTRAVENOUS
  Filled 2020-10-07: qty 50

## 2020-10-07 MED ORDER — STERILE WATER FOR INJECTION IV SOLN
INTRAVENOUS | Status: DC
  Filled 2020-10-07 (×20): qty 150

## 2020-10-07 MED ORDER — CHLORHEXIDINE GLUCONATE 0.12% ORAL RINSE (MEDLINE KIT)
15.0000 mL | Freq: Two times a day (BID) | OROMUCOSAL | Status: DC
  Administered 2020-10-07 – 2020-10-08 (×4): 15 mL via OROMUCOSAL

## 2020-10-07 MED ORDER — PIPERACILLIN-TAZOBACTAM 3.375 G IVPB 30 MIN
3.3750 g | Freq: Four times a day (QID) | INTRAVENOUS | Status: DC
  Administered 2020-10-07 – 2020-10-08 (×3): 3.375 g via INTRAVENOUS
  Filled 2020-10-07 (×9): qty 50

## 2020-10-07 MED ORDER — SODIUM BICARBONATE 8.4 % IV SOLN
INTRAVENOUS | Status: DC
  Filled 2020-10-07 (×9): qty 100

## 2020-10-07 MED ORDER — PERFLUTREN LIPID MICROSPHERE
1.0000 mL | INTRAVENOUS | Status: AC | PRN
  Administered 2020-10-07: 3 mL via INTRAVENOUS
  Filled 2020-10-07: qty 10

## 2020-10-07 MED ORDER — SUCCINYLCHOLINE CHLORIDE 200 MG/10ML IV SOSY
PREFILLED_SYRINGE | INTRAVENOUS | Status: AC
  Filled 2020-10-07: qty 10

## 2020-10-07 MED ORDER — HEPARIN SODIUM (PORCINE) 1000 UNIT/ML DIALYSIS
1000.0000 [IU] | INTRAMUSCULAR | Status: DC | PRN
  Administered 2020-10-08: 2500 [IU] via INTRAVENOUS_CENTRAL
  Filled 2020-10-07: qty 4
  Filled 2020-10-07: qty 5
  Filled 2020-10-07 (×2): qty 6

## 2020-10-07 MED ORDER — VECURONIUM BROMIDE 10 MG IV SOLR
INTRAVENOUS | Status: AC
  Filled 2020-10-07: qty 10

## 2020-10-07 MED ORDER — STERILE WATER FOR INJECTION IV SOLN
INTRAVENOUS | Status: DC
  Filled 2020-10-07 (×19): qty 150

## 2020-10-07 MED ORDER — ETOMIDATE 2 MG/ML IV SOLN
INTRAVENOUS | Status: AC
  Administered 2020-10-07: 20 mg
  Filled 2020-10-07: qty 20

## 2020-10-07 MED ORDER — SODIUM CHLORIDE 0.9 % IV SOLN
1.0000 g | Freq: Two times a day (BID) | INTRAVENOUS | Status: DC
  Filled 2020-10-07: qty 1

## 2020-10-07 MED ORDER — SODIUM CHLORIDE 0.9 % IV SOLN: INTRAVENOUS | Status: DC | PRN

## 2020-10-07 MED ORDER — POLYETHYLENE GLYCOL 3350 17 G PO PACK: 17.0000 g | PACK | Freq: Every day | ORAL | Status: DC | PRN

## 2020-10-07 NOTE — Progress Notes (Signed)
eLink Physician-Brief Progress Note Patient Name: Heather Lynn DOB: [birth year] MRN: [id number]   Date of Service  10/07/2020  HPI/Events of Note  Severe pancreatitis with septic shock and marked lactic acidosis, blood pressure has improved with volume resuscitation and Phenylephrine infusion.  eICU Interventions  Interval labs ordered, depending on ABG plan is for trial of BIPAP to help her work of breathing.        [name] Giomar Gusler 10/07/2020, 1:46 AM

## 2020-10-07 NOTE — Progress Notes (Signed)
[facility] Progress Note  Subjective: on vent, sedated,   Vitals:   10/07/20 0700 10/07/20 0800 10/07/20 0803 10/07/20 0900  BP:      Pulse: (!) 126 (!) 110    Resp: (!) 25 (!) 28  (!) 28  Temp: (!) 100.9 F (38.3 C) 99.3 F (37.4 C)  (!) 97.5 F (36.4 C)  TempSrc:      SpO2: 100% 100% 100%   Weight:      Height:        Exam: on vent ,sedated  no jvd  throat ett in place  Chest cta bilat and lat  Cor reg no RG  Abd soft ntnd no ascites   Ext diffuse 2+ edema or UE's/ LE's/ face   Neuro on vent and sedated, no following commands    CXR - no acute disease, hazy overlay   Assessment/ Plan: 1. Renal failure - AKI in setting of severe pancreatitis and shock syndrome. No prior pmh otherwise. Likely ATN d/ t ischemia. Will follow, cont CRRT which was started this am.   2. Hyperkalemia - f/u K+ pending this am. Was 7.2 at 2 am.  3. Acute pancreatitis - severe, clinically 4. Anemia - supportive care, tx prn 5. Shock - +7-8 L, keep even on CRRT     Rob Arthur Aydelotte 10/07/2020, 1:00 PM   Recent Labs  Lab 10/07/20 0220 10/07/20 0220 10/07/20 0229 10/07/20 0559  K 7.2*  7.4*   < > 7.2* 7.2*  7.2*  BUN 23*  25*   < > 24* 29*  CREATININE 3.02*  3.09*   < > 3.04* 3.65*  CALCIUM 6.0*  5.9*   < > 6.1* 5.8*  PHOS 4.8*   < > 4.6 4.2  HGB 10.1*  --   --  9.4*   < > = values in this interval not displayed.   Inpatient medications: . chlorhexidine gluconate (MEDLINE KIT)  15 mL Mouth Rinse BID  . Chlorhexidine Gluconate Cloth  6 each Topical Daily  . doxycycline  100 mg Per Tube BID  . etomidate      . feeding supplement (VITAL HIGH PROTEIN)  1,000 mL Per Tube Q24H  . heparin injection (subcutaneous)  5,000 Units Subcutaneous Q8H  . insulin aspart  0-15 Units Subcutaneous Q4H  . mouth rinse  15 mL Mouth Rinse 10 times per day  . pantoprazole (PROTONIX) IV  40 mg Intravenous Q24H  . succinylcholine      . vecuronium       . sodium chloride 200 mL/hr  at 10/07/20 1200  . sodium chloride    . calcium gluconate    . fentaNYL infusion INTRAVENOUS 150 mcg/hr (10/07/20 1200)  . magnesium sulfate bolus IVPB 2 g (10/07/20 1259)  . midazolam 4 mg/hr (10/07/20 1200)  . norepinephrine (LEVOPHED) Adult infusion 16 mcg/min (10/07/20 1200)  . phenylephrine (NEO-SYNEPHRINE) Adult infusion 50 mcg/min (10/07/20 1200)  . piperacillin-tazobactam Stopped (10/07/20 1243)  . prismasol BGK 0/2.5 2,000 mL/hr at 10/07/20 0703  . sodium bicarbonate in D5W 1000 mL infusion 150 mL/hr at 10/07/20 1241  . sodium bicarbonate (isotonic) 1000 mL infusion 500 mL/hr at 10/07/20 1240  . sodium bicarbonate (isotonic) 1000 mL infusion 300 mL/hr at 10/07/20 0934  . vasopressin 0.03 Units/min (10/07/20 1215)   Place/Maintain arterial line **AND** sodium chloride, acetaminophen **OR** acetaminophen, docusate, fentaNYL, fentaNYL (SUBLIMAZE) injection, heparin, midazolam, perflutren lipid microspheres (DEFINITY) IV suspension, polyethylene glycol, sodium chloride

## 2020-10-07 NOTE — TOC Initial Note (Signed)
Transition of Care [facility]) - Initial/Assessment Note    Patient Details  Name: Heather Lynn MRN: [id number] Date of Birth: Jul 09, 2001  Transition of Care [facility]) CM/SW Contact:    Golda Acre, RN Phone Number: 10/07/2020, 7:43 AM  Clinical Narrative:                 19 y.o. female with history of depression presents to the ER with complaints of abdominal pain.  Patient has been a abdominal pain for the last 24 hours with nausea vomiting.  Pain is mostly in the epigastric area radiating to the back.  Denies any chest pain or shortness of breath or denies any fever or chills.  ED Course: In the ER CT abdomen pelvis is consistent with acute pancreatitis.  No definite signs of any gallstones.  Patient denies drinking alcohol.  Triglycerides are 181.  Lipase is 1300.  AST 46 ALT 55.  High sensitive troponins were negative.  EKG shows sinus tachycardia.  Patient was started on fluid bolus pain relief medication admitted for acute pancreatitis cause not clear.  Intubated am of 102221/ c.line 102221, CRRT Iv nahco3 drip, iv sedation, iv pressors.  WCB-32.6, k+=7.2 BUN=29, creat 3.35 Plan uncertain at this time  Following for progression Expected Discharge Plan: Home w Hospice Care Barriers to Discharge: Barriers Unresolved (comment)   Patient Goals and CMS Choice Patient states their goals for this hospitalization and ongoing recovery are:: unable to speaqk intubated 102221 early am CMS Medicare.gov Compare Post Acute Care list provided to:: Patient    Expected Discharge Plan and Services Expected Discharge Plan: Home w Hospice Care   Discharge Planning Services: CM Consult   Living arrangements for the past 2 months: Apartment                                      Prior Living Arrangements/Services Living arrangements for the past 2 months: Apartment Lives with:: Spouse Patient language and need for interpreter reviewed:: Yes        Need for Family Participation in  Patient Care: Yes (Comment) Care giver support system in place?: Yes (comment)   Criminal Activity/Legal Involvement Pertinent to Current Situation/Hospitalization: No - Comment as needed  Activities of Daily Living Home Assistive Devices/Equipment: None ADL Screening (condition at time of admission) Patient's cognitive ability adequate to safely complete daily activities?: Yes Is the patient deaf or have difficulty hearing?: No Does the patient have difficulty seeing, even when wearing glasses/contacts?: No Does the patient have difficulty concentrating, remembering, or making decisions?: No Patient able to express need for assistance with ADLs?: Yes Does the patient have difficulty dressing or bathing?: No Independently performs ADLs?: Yes (appropriate for developmental age) Does the patient have difficulty walking or climbing stairs?: No Weakness of Legs: None Weakness of Arms/Hands: None  Permission Sought/Granted                  Emotional Assessment Appearance:: Appears stated age Attitude/Demeanor/Rapport: Unable to Assess Affect (typically observed): Unable to Assess   Alcohol / Substance Use: Not Applicable Psych Involvement: No (comment)  Admission diagnosis:  Acute pancreatitis [K85.90] Pancreatitis [K85.90] Hypoxia [R09.02] Abdominal pain [R10.9] Leukocytosis, unspecified type [D72.829] Acute pancreatitis, unspecified complication status, unspecified pancreatitis type [K85.90] Elevated liver transaminase level [R74.01] Patient Active Problem List   Diagnosis Date Noted  . Acute pancreatitis 10/06/2020  . Pancreatitis 10/06/2020   PCP:  Assunta Found, MD  Pharmacy:   [id number] [address] [address], [place] - [phone] [place] [address] Phone: [phone] Fax: [phone]     Social Determinants of Health (SDOH) Interventions    Readmission Risk Interventions No flowsheet data found.

## 2020-10-07 NOTE — Progress Notes (Signed)
Events of past night noted.  Patient now intubated and receiving continuous CRRT dialysis, oliguric.  On exam, the patient's abdomen is quite firm.  Intra-abdominal pressures, however, do not suggest a compartment syndrome.  I checked with radiology; without giving IV contrast (which is relatively contraindicated given the patient's renal failure), the value of a repeat CT to check for necrosis would be minimal.  Since the patient's white count is decreasing and she is not febrile, I do not think infected necrosis is unlikely scenario at the present time, therefore, a repeat CT at this time would probably not change our management.  The patient's lipase level of 4400 this morning is difficult to interpret in the setting of renal failure, in terms of progression versus improvement, but quite clearly the patient still has active pancreatic inflammation going on.  The etiology of the patient's pancreatitis remains unclear.  Her IgG4 level came back in the normal range, making autoimmune pancreatitis somewhat less likely.  No additional recommendations from GI at this time.  We appreciate the care she is receiving from the ICU team.  Florencia Reasons, M.D. Pager [phone] If no answer or after 5 PM call [phone]

## 2020-10-07 NOTE — Progress Notes (Signed)
RT Note: ABG obtained with pt. on 5 lpm n/c, blood sample ran on I-STAT, E-Link made aware of results by RN.

## 2020-10-07 NOTE — Consult Note (Signed)
[facility] Consult Note  Heather Lynn 11-29-2001  [id number].    Requesting MD: Hunsucker Chief Complaint/Reason for Consult: abdominal compartment syndrome  HPI:  Patient is a 19 year old female who presented to [facility] 10/20 with abdominal pain for 24 hrs with associated nausea and vomiting. Found to have acute severe pancreatitis of unclear etiology. No gallstones on imaging, TG mildly elevated, no report of binge drinking or new medications. GI and CCM were both consulted 10/21. Patient developed worsening renal failure despite aggressive IV hydration. Patient became hypotensive with lactic acidosis. Central line inserted and patient started on pressors and empiric antibiotics given rapid progression. Patient developed worsening respiratory failure and failed trial of BiPAP, intubated early AM of 10/22. General surgery asked to evaluate due to concern for abdominal compartment syndrome. Bladder pressure measured at 26 mm Hg around 0900 10/22, patient not paralyzed.   PMH significant for depression/anxiety, on prozac, klonopin, and seroquel at home. No past abdominal surgery. Allergy listed to lamictal. Patient lives with her husband.   ROS: Review of Systems  Unable to perform ROS: Intubated    Family History  Problem Relation Age of Onset  . Alcohol abuse Mother   . Depression Mother   . Depression Half-Brother   . Alcohol abuse Half-Brother   . Post-traumatic stress disorder Half-Sister     Past Medical History:  Diagnosis Date  . Anxiety     Past Surgical History:  Procedure Laterality Date  . WISDOM TOOTH EXTRACTION      Social History:  reports that she has quit smoking. She has never used smokeless tobacco. She reports previous alcohol use. She reports previous drug use.  Allergies:  Allergies  Allergen Reactions  . Lamictal [Lamotrigine]     Severe anger    Medications Prior to Admission  Medication Sig Dispense Refill  . [START ON 10/09/2020]  clonazePAM (KLONOPIN) 1 MG tablet Take 1 tablet (1 mg total) by mouth daily as needed for anxiety. 30 tablet 1  . FLUoxetine (PROZAC) 20 MG capsule Take 1 capsule (20 mg total) by mouth daily. 30 capsule 1  . QUEtiapine (SEROQUEL) 400 MG tablet Take 1 tablet (400 mg total) by mouth at bedtime. 30 tablet 1    Blood pressure (!) 87/46, pulse (!) 103, temperature (!) 96.3 F (35.7 C), resp. rate (!) 30, height 5' 4.02" (1.626 m), weight 83.9 kg, last menstrual period 09/25/2020, SpO2 100 %. Physical Exam:  General: WD, obese female who is critically ill and sedated on the vent  HEENT:  Sclera are anicteric. Ears and nose without any masses or lesions.  Mouth is pink and dry Heart: sinus tachycardia. Radial pulses 2+ BL, pedal pulses 1+ and thready BL Lungs: on the vent with rales in bases bilaterally  Abd: no involuntary guarding, BS hypoactive, abdomen is taut but still compressible, NGT in place with bilious drainage  MS: all 4 extremities are symmetrical without obvious deformity, mottling of BLEs Skin: cool and dry with no masses, lesions, or rashes Neuro: sedated   Results for orders placed or performed during the hospital encounter of Oct 07, 2020 (from the past 48 hour(s))  Basic metabolic panel     Status: Abnormal   Collection Time: October 07, 2020 10:03 PM  Result Value Ref Range   Sodium 138 135 - 145 mmol/L   Potassium 3.5 3.5 - 5.1 mmol/L   Chloride 103 98 - 111 mmol/L   CO2 22 22 - 32 mmol/L   Glucose, Bld 143 (H) 70 - 99  mg/dL    Comment: Glucose reference range applies only to samples taken after fasting for at least 8 hours.   BUN 13 6 - 20 mg/dL   Creatinine, Ser 1.61 0.44 - 1.00 mg/dL   Calcium 9.2 8.9 - 09.6 mg/dL   GFR, Estimated >04 >54 mL/min   Anion gap 13 5 - 15    Comment: Performed at [facility], [address], [place], [place] [address]  CBC     Status: Abnormal   Collection Time: 09/21/2020 10:03 PM  Result Value Ref Range   WBC 21.8 (H) 4.0 -  10.5 K/uL   RBC 5.01 3.87 - 5.11 MIL/uL   Hemoglobin 12.9 12.0 - 15.0 g/dL   HCT 91.4 36 - 46 %   MCV 82.2 80.0 - 100.0 fL   MCH 25.7 (L) 26.0 - 34.0 pg   MCHC 31.3 30.0 - 36.0 g/dL   RDW 78.2 (H) 95.6 - 21.3 %   Platelets 457 (H) 150 - 400 K/uL   nRBC 0.0 0.0 - 0.2 %    Comment: Performed at [facility], [address] [place], [place] [address]  Troponin I (High Sensitivity)     Status: None   Collection Time: 10/07/2020 10:03 PM  Result Value Ref Range   Troponin I (High Sensitivity) <2 <18 ng/L    Comment: (NOTE) Elevated high sensitivity troponin I (hsTnI) values and significant  changes across serial measurements may suggest ACS but many other  chronic and acute conditions are known to elevate hsTnI results.  Refer to the "Links" section for chest pain algorithms and additional  guidance. Performed at [facility], [address], [place], [place] [address]   Differential     Status: Abnormal   Collection Time: 09/29/2020 10:03 PM  Result Value Ref Range   Neutrophils Relative % 63 %   Neutro Abs 13.7 (H) 1.7 - 7.7 K/uL   Lymphocytes Relative 27 %   Lymphs Abs 6.0 (H) 0.7 - 4.0 K/uL   Monocytes Relative 6 %   Monocytes Absolute 1.3 (H) 0.1 - 1.0 K/uL   Eosinophils Relative 2 %   Eosinophils Absolute 0.4 0.0 - 0.5 K/uL   Basophils Relative 0 %   Basophils Absolute 0.1 0.0 - 0.1 K/uL   Immature Granulocytes 2 %   Abs Immature Granulocytes 0.37 (H) 0.00 - 0.07 K/uL    Comment: Performed at [facility], [address] [place], [place] [address]  I-Stat beta hCG blood, ED     Status: None   Collection Time: 09/25/2020 10:11 PM  Result Value Ref Range   I-stat hCG, quantitative <5.0 <5 mIU/mL   Comment 3            Comment:   GEST. AGE      CONC.  (mIU/mL)   <=1 WEEK        5 - 50     2 WEEKS       50 - 500     3 WEEKS       100 - 10,000     4 WEEKS     1,000 - 30,000        FEMALE AND NON-PREGNANT FEMALE:      LESS THAN 5 mIU/mL   Hepatic function panel     Status: Abnormal   Collection Time: 10/06/20 12:22 AM  Result Value Ref Range   Total Protein 7.6 6.5 - 8.1 g/dL   Albumin  3.3 (L) 3.5 - 5.0 g/dL   AST 46 (H) 15 - 41 U/L   ALT 55 (H) 0 - 44 U/L   Alkaline Phosphatase 79 38 - 126 U/L   Total Bilirubin 0.8 0.3 - 1.2 mg/dL   Bilirubin, Direct 0.1 0.0 - 0.2 mg/dL   Indirect Bilirubin 0.7 0.3 - 0.9 mg/dL    Comment: Performed at [facility], [address], [place], [place] [address]  Lipase, blood     Status: Abnormal   Collection Time: 10/06/20 12:22 AM  Result Value Ref Range   Lipase 1,346 (H) 11 - 51 U/L    Comment: RESULTS CONFIRMED BY MANUAL DILUTION Performed at [facility], [address], [place], [place] [address]   Troponin I (High Sensitivity)     Status: None   Collection Time: 10/06/20 12:39 AM  Result Value Ref Range   Troponin I (High Sensitivity) <2 <18 ng/L    Comment: (NOTE) Elevated high sensitivity troponin I (hsTnI) values and significant  changes across serial measurements may suggest ACS but many other  chronic and acute conditions are known to elevate hsTnI results.  Refer to the "Links" section for chest pain algorithms and additional  guidance. Performed at [facility], [address] [place], [place] [address]   Urinalysis, Routine w reflex microscopic Urine, Clean Catch     Status: Abnormal   Collection Time: 10/06/20  2:00 AM  Result Value Ref Range   Color, Urine STRAW (A) YELLOW   APPearance CLEAR CLEAR   Specific Gravity, Urine 1.034 (H) 1.005 - 1.030   pH 5.0 5.0 - 8.0   Glucose, UA NEGATIVE NEGATIVE mg/dL   Hgb urine dipstick MODERATE (A) NEGATIVE   Bilirubin Urine NEGATIVE NEGATIVE   Ketones, ur 5 (A) NEGATIVE mg/dL   Protein, ur NEGATIVE NEGATIVE mg/dL   Nitrite NEGATIVE NEGATIVE   Leukocytes,Ua NEGATIVE NEGATIVE   RBC / HPF 11-20 0 - 5 RBC/hpf   WBC, UA 0-5 0 - 5 WBC/hpf    Bacteria, UA NONE SEEN NONE SEEN   Squamous Epithelial / LPF 0-5 0 - 5    Comment: Performed at [facility], [address], [place], [place] [address]  Triglycerides     Status: Abnormal   Collection Time: 10/06/20  3:04 AM  Result Value Ref Range   Triglycerides 181 (H) <150 mg/dL    Comment: Performed at [facility], [address], [place], [place] [address]  Respiratory Panel by RT PCR (Flu A&B, Covid) - Nasopharyngeal Swab     Status: None   Collection Time: 10/06/20  3:04 AM   Specimen: Nasopharyngeal Swab  Result Value Ref Range   SARS Coronavirus 2 by RT PCR NEGATIVE NEGATIVE    Comment: (NOTE) SARS-CoV-2 target nucleic acids are NOT DETECTED.  The SARS-CoV-2 RNA is generally detectable in upper respiratoy specimens during the acute phase of infection. The lowest concentration of SARS-CoV-2 viral copies this assay can detect is 131 copies/mL. A negative result does not preclude SARS-Cov-2 infection and should not be used as the sole basis for treatment or other patient management decisions. A negative result may occur with  improper specimen collection/handling, submission of specimen other than nasopharyngeal swab, presence of viral mutation(s) within the areas targeted by this assay, and inadequate number of viral copies (<131 copies/mL). A negative result must be combined with clinical observations, patient history, and epidemiological information. The expected result is Negative.  Fact Sheet  for Patients:  https://www.moore.com/  Fact Sheet for Healthcare Providers:  https://www.young.biz/  This test is no t yet approved or cleared by the [place] FDA and  has been authorized for detection and/or diagnosis of SARS-CoV-2 by FDA under an Emergency Use Authorization (EUA). This EUA will remain  in effect (meaning this test can be used) for the duration of the COVID-19 declaration under  Section 564(b)(1) of the Act, 21 U.S.C. section 360bbb-3(b)(1), unless the authorization is terminated or revoked sooner.     Influenza A by PCR NEGATIVE NEGATIVE   Influenza B by PCR NEGATIVE NEGATIVE    Comment: (NOTE) The Xpert Xpress SARS-CoV-2/FLU/RSV assay is intended as an aid in  the diagnosis of influenza from Nasopharyngeal swab specimens and  should not be used as a sole basis for treatment. Nasal washings and  aspirates are unacceptable for Xpert Xpress SARS-CoV-2/FLU/RSV  testing.  Fact Sheet for Patients: https://www.moore.com/  Fact Sheet for Healthcare Providers: https://www.young.biz/  This test is not yet approved or cleared by the [place] FDA and  has been authorized for detection and/or diagnosis of SARS-CoV-2 by  FDA under an Emergency Use Authorization (EUA). This EUA will remain  in effect (meaning this test can be used) for the duration of the  Covid-19 declaration under Section 564(b)(1) of the Act, 21  U.S.C. section 360bbb-3(b)(1), unless the authorization is  terminated or revoked. Performed at [facility], [address], [place], [place] [address]   CBG monitoring, ED     Status: Abnormal   Collection Time: 10/06/20  7:32 AM  Result Value Ref Range   Glucose-Capillary 191 (H) 70 - 99 mg/dL    Comment: Glucose reference range applies only to samples taken after fasting for at least 8 hours.  HIV Antibody (routine testing w rflx)     Status: None   Collection Time: 10/06/20  7:44 AM  Result Value Ref Range   HIV Screen 4th Generation wRfx Non Reactive Non Reactive    Comment: Performed at [facility], [address] [place], [place] [address]  Creatinine, serum     Status: None   Collection Time: 10/06/20  7:44 AM  Result Value Ref Range   Creatinine, Ser 0.78 0.44 - 1.00 mg/dL   GFR, Estimated >09 >81 mL/min    Comment: (NOTE) Calculated using the CKD-EPI Creatinine  Equation (2021) Performed at [facility], [address], [place], [place] [address]   Hepatic function panel     Status: Abnormal   Collection Time: 10/06/20  7:44 AM  Result Value Ref Range   Total Protein 7.1 6.5 - 8.1 g/dL   Albumin 3.0 (L) 3.5 - 5.0 g/dL   AST 47 (H) 15 - 41 U/L   ALT 48 (H) 0 - 44 U/L   Alkaline Phosphatase 73 38 - 126 U/L   Total Bilirubin 0.4 0.3 - 1.2 mg/dL   Bilirubin, Direct 0.1 0.0 - 0.2 mg/dL   Indirect Bilirubin 0.3 0.3 - 0.9 mg/dL    Comment: Performed at [facility], [address], [place], [place] [address]  CBC WITH DIFFERENTIAL     Status: Abnormal   Collection Time: 10/06/20  7:44 AM  Result Value Ref Range   WBC 40.3 (H) 4.0 - 10.5 K/uL   RBC 5.61 (H) 3.87 - 5.11 MIL/uL   Hemoglobin 14.3 12.0 - 15.0 g/dL   HCT 21.3 (H) 36 - 46 %   MCV 84.5 80.0 -  100.0 fL   MCH 25.5 (L) 26.0 - 34.0 pg   MCHC 30.2 30.0 - 36.0 g/dL   RDW 16.1 (H) 09.6 - 04.5 %   Platelets 525 (H) 150 - 400 K/uL   nRBC 0.0 0.0 - 0.2 %   Neutrophils Relative % 86 %   Neutro Abs 34.7 (H) 1.7 - 7.7 K/uL   Lymphocytes Relative 6 %   Lymphs Abs 2.4 0.7 - 4.0 K/uL   Monocytes Relative 6 %   Monocytes Absolute 2.4 (H) 0.1 - 1.0 K/uL   Eosinophils Relative 0 %   Eosinophils Absolute 0.0 0.0 - 0.5 K/uL   Basophils Relative 0 %   Basophils Absolute 0.1 0.0 - 0.1 K/uL   WBC Morphology VACUOLATED NEUTROPHILS    Immature Granulocytes 2 %   Abs Immature Granulocytes 0.65 (H) 0.00 - 0.07 K/uL    Comment: Performed at [facility], [address], [place], [place] [address]  Urine rapid drug screen (hosp performed)     Status: Abnormal   Collection Time: 10/06/20 11:54 AM  Result Value Ref Range   Opiates POSITIVE (A) NONE DETECTED   Cocaine NONE DETECTED NONE DETECTED   Benzodiazepines NONE DETECTED NONE DETECTED   Amphetamines NONE DETECTED NONE DETECTED   Tetrahydrocannabinol POSITIVE (A) NONE DETECTED   Barbiturates  NONE DETECTED NONE DETECTED    Comment: (NOTE) DRUG SCREEN FOR MEDICAL PURPOSES ONLY.  IF CONFIRMATION IS NEEDED FOR ANY PURPOSE, NOTIFY LAB WITHIN 5 DAYS.  LOWEST DETECTABLE LIMITS FOR URINE DRUG SCREEN Drug Class                     Cutoff (ng/mL) Amphetamine and metabolites    1000 Barbiturate and metabolites    200 Benzodiazepine                 200 Tricyclics and metabolites     300 Opiates and metabolites        300 Cocaine and metabolites        300 THC                            50 Performed at [facility], [address], [place], [place] [address]   CBG monitoring, ED     Status: Abnormal   Collection Time: 10/06/20 11:56 AM  Result Value Ref Range   Glucose-Capillary 170 (H) 70 - 99 mg/dL    Comment: Glucose reference range applies only to samples taken after fasting for at least 8 hours.  Comprehensive metabolic panel     Status: Abnormal   Collection Time: 10/06/20  2:27 PM  Result Value Ref Range   Sodium 139 135 - 145 mmol/L   Potassium 6.1 (H) 3.5 - 5.1 mmol/L    Comment: DELTA CHECK NOTED REPEATED TO VERIFY NO VISIBLE HEMOLYSIS    Chloride 103 98 - 111 mmol/L   CO2 14 (L) 22 - 32 mmol/L   Glucose, Bld 209 (H) 70 - 99 mg/dL    Comment: Glucose reference range applies only to samples taken after fasting for at least 8 hours.   BUN 14 6 - 20 mg/dL   Creatinine, Ser 8.29 (H) 0.44 - 1.00 mg/dL   Calcium 7.6 (L) 8.9 - 10.3 mg/dL   Total Protein 6.2 (L) 6.5 - 8.1 g/dL   Albumin 2.7 (L) 3.5 - 5.0 g/dL   AST 60 (H) 15 - 41 U/L  ALT 41 0 - 44 U/L   Alkaline Phosphatase 61 38 - 126 U/L   Total Bilirubin 0.8 0.3 - 1.2 mg/dL   GFR, Estimated 53 (L) >60 mL/min    Comment: (NOTE) Calculated using the CKD-EPI Creatinine Equation (2021)    Anion gap 22 (H) 5 - 15    Comment: REPEATED TO VERIFY Performed at [facility], [address] [place], [place] [address]   CBC     Status: Abnormal   Collection Time: 10/06/20  2:27  PM  Result Value Ref Range   WBC 49.1 (H) 4.0 - 10.5 K/uL   RBC 5.67 (H) 3.87 - 5.11 MIL/uL   Hemoglobin 14.6 12.0 - 15.0 g/dL   HCT 60.4 (H) 36 - 46 %   MCV 88.7 80.0 - 100.0 fL   MCH 25.7 (L) 26.0 - 34.0 pg   MCHC 29.0 (L) 30.0 - 36.0 g/dL   RDW 54.0 (H) 98.1 - 19.1 %   Platelets 512 (H) 150 - 400 K/uL   nRBC 0.1 0.0 - 0.2 %    Comment: Performed at [facility], [address] [place], [place] [address]  Lipase, blood     Status: Abnormal   Collection Time: 10/06/20  2:27 PM  Result Value Ref Range   Lipase 5,525 (H) 11 - 51 U/L    Comment: RESULTS CONFIRMED BY MANUAL DILUTION Performed at [facility], [address], [place], [place] [address]   IgG 4     Status: None   Collection Time: 10/06/20  2:27 PM  Result Value Ref Range   IgG, Subclass 4 54 2 - 96 mg/dL    Comment: (NOTE) Performed At: [facility] [address] [place], [place] [id number] Jolene Schimke MD GE:[id number]   Culture, blood (single)     Status: None (Preliminary result)   Collection Time: 10/06/20  3:01 PM   Specimen: BLOOD  Result Value Ref Range   Specimen Description      BLOOD LEFT ANTECUBITAL Performed at [facility], [address] [place], [place] [address]    Special Requests      BOTTLES DRAWN AEROBIC AND ANAEROBIC Blood Culture adequate volume Performed at [facility], [address], [place], [place] [address]    Culture      NO GROWTH < 12 HOURS Performed at [facility], [address] [place], [place] [address]    Report Status PENDING   Lactic acid, plasma     Status: Abnormal   Collection Time: 10/06/20  6:14 PM  Result Value Ref Range   Lactic Acid, Venous 10.9 (HH) 0.5 - 1.9 mmol/L    Comment: CRITICAL RESULT CALLED TO, READ BACK BY AND VERIFIED WITH: I.HODGES AT 2014 ON 10/06/20 BY N.THOMPSON Performed at [facility], [address], [place], [place]  [address]   Procalcitonin - Baseline     Status: None   Collection Time: 10/06/20  6:14 PM  Result Value Ref Range   Procalcitonin 3.89 ng/mL    Comment:        Interpretation: PCT > 2 ng/mL: Systemic infection (sepsis) is likely, unless other causes are known. (NOTE)       Sepsis PCT Algorithm           Lower Respiratory Tract  Infection PCT Algorithm    ----------------------------     ----------------------------         PCT < 0.25 ng/mL                PCT < 0.10 ng/mL          Strongly encourage             Strongly discourage   discontinuation of antibiotics    initiation of antibiotics    ----------------------------     -----------------------------       PCT 0.25 - 0.50 ng/mL            PCT 0.10 - 0.25 ng/mL               OR       >80% decrease in PCT            Discourage initiation of                                            antibiotics      Encourage discontinuation           of antibiotics    ----------------------------     -----------------------------         PCT >= 0.50 ng/mL              PCT 0.26 - 0.50 ng/mL               AND       <80% decrease in PCT              Encourage initiation of                                             antibiotics       Encourage continuation           of antibiotics    ----------------------------     -----------------------------        PCT >= 0.50 ng/mL                  PCT > 0.50 ng/mL               AND         increase in PCT                  Strongly encourage                                      initiation of antibiotics    Strongly encourage escalation           of antibiotics                                     -----------------------------                                           PCT <= 0.25 ng/mL  OR                                        > 80% decrease in PCT                                      Discontinue / Do not initiate                                              antibiotics  Performed at [facility], [address] [place], [place] [address]   CBG monitoring, ED     Status: Abnormal   Collection Time: 10/06/20  6:30 PM  Result Value Ref Range   Glucose-Capillary 226 (H) 70 - 99 mg/dL    Comment: Glucose reference range applies only to samples taken after fasting for at least 8 hours.  CBG monitoring, ED     Status: Abnormal   Collection Time: 10/06/20  7:53 PM  Result Value Ref Range   Glucose-Capillary 295 (H) 70 - 99 mg/dL    Comment: Glucose reference range applies only to samples taken after fasting for at least 8 hours.  Blood gas, arterial     Status: Abnormal   Collection Time: 10/06/20  8:44 PM  Result Value Ref Range   FIO2 ROOM AIR    pH, Arterial 7.091 (LL) 7.35 - 7.45    Comment: CRITICAL RESULT CALLED TO, READ BACK BY AND VERIFIED WITH: HODGES,I. RN @2054  ON 10.21.2021 BY COHEN,K    pCO2 arterial 39.0 32 - 48 mmHg   pO2, Arterial 124 (H) 83 - 108 mmHg   Bicarbonate 11.3 (L) 20.0 - 28.0 mmol/L   Acid-base deficit 17.6 (H) 0.0 - 2.0 mmol/L   O2 Saturation 97.2 %   Patient temperature 98.6    Collection site RIGHT RADIAL    Drawn by 10.23.2021    Sample type ARTERIAL    Allens test (pass/fail) PASS PASS    Comment: Performed at [facility], [address], [place], [place]  Lactic acid, plasma     Status: Abnormal   Collection Time: 10/06/20  9:08 PM  Result Value Ref Range   Lactic Acid, Venous 9.2 (HH) 0.5 - 1.9 mmol/L    Comment: CRITICAL VALUE NOTED.  VALUE IS CONSISTENT WITH PREVIOUSLY REPORTED AND CALLED VALUE. Performed at [facility], [address], [place], [place]   Cortisol     Status: None   Collection Time: 10/06/20 10:45 PM  Result Value Ref Range   Cortisol, Plasma 68.4 ug/dL    Comment: RESULTS CONFIRMED BY MANUAL DILUTION (NOTE) AM    6.7 - 22.6 ug/dL PM   10/03/2020       ug/dL Performed at  [facility], [address], [place], [place]   MRSA PCR Screening     Status: None   Collection Time: 10/06/20 10:47 PM   Specimen: Nasal Mucosa; Nasopharyngeal  Result Value Ref Range   MRSA by PCR NEGATIVE NEGATIVE    Comment:        The GeneXpert MRSA Assay (FDA approved for NASAL specimens only), is one component of a comprehensive  MRSA colonization surveillance program. It is not intended to diagnose MRSA infection nor to guide or monitor treatment for MRSA infections. Performed at [facility], [address] [place], [place] [id number]   Glucose, capillary     Status: Abnormal   Collection Time: 10/06/20 11:26 PM  Result Value Ref Range   Glucose-Capillary 172 (H) 70 - 99 mg/dL    Comment: Glucose reference range applies only to samples taken after fasting for at least 8 hours.  I-STAT 7, (LYTES, BLD GAS, ICA, H+H)     Status: Abnormal   Collection Time: 10/07/20  1:48 AM  Result Value Ref Range   pH, Arterial 7.185 (LL) 7.35 - 7.45   pCO2 arterial 34.7 32 - 48 mmHg   pO2, Arterial 72 (L) 83 - 108 mmHg   Bicarbonate 12.9 (L) 20.0 - 28.0 mmol/L   TCO2 14 (L) 22 - 32 mmol/L   O2 Saturation 89.0 %   Acid-base deficit 14.0 (H) 0.0 - 2.0 mmol/L   Sodium 138 135 - 145 mmol/L   Potassium 6.6 (HH) 3.5 - 5.1 mmol/L   Calcium, Ion 0.78 (LL) 1.15 - 1.40 mmol/L   HCT 30.0 (L) 36 - 46 %   Hemoglobin 10.2 (L) 12.0 - 15.0 g/dL   Patient temperature 604.5100.5 F    Sample type ARTERIAL    Comment VALUES EXPECTED, NO REPEAT   Procalcitonin     Status: None   Collection Time: 10/07/20  2:20 AM  Result Value Ref Range   Procalcitonin 7.53 ng/mL    Comment:        Interpretation: PCT > 2 ng/mL: Systemic infection (sepsis) is likely, unless other causes are known. (NOTE)       Sepsis PCT Algorithm           Lower Respiratory Tract                                      Infection PCT Algorithm    ----------------------------      ----------------------------         PCT < 0.25 ng/mL                PCT < 0.10 ng/mL          Strongly encourage             Strongly discourage   discontinuation of antibiotics    initiation of antibiotics    ----------------------------     -----------------------------       PCT 0.25 - 0.50 ng/mL            PCT 0.10 - 0.25 ng/mL               OR       >80% decrease in PCT            Discourage initiation of                                            antibiotics      Encourage discontinuation           of antibiotics    ----------------------------     -----------------------------         PCT >= 0.50 ng/mL  PCT 0.26 - 0.50 ng/mL               AND       <80% decrease in PCT              Encourage initiation of                                             antibiotics       Encourage continuation           of antibiotics    ----------------------------     -----------------------------        PCT >= 0.50 ng/mL                  PCT > 0.50 ng/mL               AND         increase in PCT                  Strongly encourage                                      initiation of antibiotics    Strongly encourage escalation           of antibiotics                                     -----------------------------                                           PCT <= 0.25 ng/mL                                                 OR                                        > 80% decrease in PCT                                      Discontinue / Do not initiate                                             antibiotics  Performed at [facility], [address], [place], [place] [address]   CBC     Status: Abnormal   Collection Time: 10/07/20  2:20 AM  Result Value Ref Range   WBC 40.5 (H) 4.0 - 10.5 K/uL   RBC 3.95 3.87 - 5.11 MIL/uL   Hemoglobin 10.1 (L) 12.0 - 15.0 g/dL    Comment: REPEATED TO VERIFY   HCT 34.7 (L) 36 - 46 %   MCV 87.8  80.0 - 100.0 fL   MCH 25.6  (L) 26.0 - 34.0 pg   MCHC 29.1 (L) 30.0 - 36.0 g/dL   RDW 16.1 (H) 09.6 - 04.5 %   Platelets 296 150 - 400 K/uL   nRBC 0.3 (H) 0.0 - 0.2 %    Comment: Performed at [facility], [address], [place], [place] [address]  Lipase, blood     Status: Abnormal   Collection Time: 10/07/20  2:20 AM  Result Value Ref Range   Lipase 4,426 (H) 11 - 51 U/L    Comment: RESULTS CONFIRMED BY MANUAL DILUTION Performed at [facility], [address], [place], [place] [address]   Magnesium     Status: None   Collection Time: 10/07/20  2:20 AM  Result Value Ref Range   Magnesium 1.7 1.7 - 2.4 mg/dL    Comment: Performed at [facility], [address], [place], [place] [address]  Phosphorus     Status: Abnormal   Collection Time: 10/07/20  2:20 AM  Result Value Ref Range   Phosphorus 4.8 (H) 2.5 - 4.6 mg/dL    Comment: Performed at [facility], [address] [place], [place] [address]  Comprehensive metabolic panel     Status: Abnormal   Collection Time: 10/07/20  2:20 AM  Result Value Ref Range   Sodium 138 135 - 145 mmol/L   Potassium 7.2 (HH) 3.5 - 5.1 mmol/L    Comment: NO VISIBLE HEMOLYSIS CRITICAL RESULT CALLED TO, READ BACK BY AND VERIFIED WITH: JENNA BURDICK @ 0315 ON 10/07/20 C VARNER    Chloride 108 98 - 111 mmol/L   CO2 13 (L) 22 - 32 mmol/L   Glucose, Bld 182 (H) 70 - 99 mg/dL    Comment: Glucose reference range applies only to samples taken after fasting for at least 8 hours.   BUN 23 (H) 6 - 20 mg/dL   Creatinine, Ser 6.57 (H) 0.44 - 1.00 mg/dL   Calcium 6.0 (LL) 8.9 - 10.3 mg/dL    Comment: CRITICAL RESULT CALLED TO, READ BACK BY AND VERIFIED WITH: JENNA BURDICK @ 0315 ON 10/07/20 C VARNER    Total Protein 5.9 (L) 6.5 - 8.1 g/dL   Albumin 3.8 3.5 - 5.0 g/dL   AST 8,469 (H) 15 - 41 U/L   ALT 638 (H) 0 - 44 U/L   Alkaline Phosphatase 32 (L) 38 - 126 U/L   Total Bilirubin 1.6 (H) 0.3 - 1.2 mg/dL    GFR, Estimated 22 (L) >60 mL/min    Comment: (NOTE) Calculated using the CKD-EPI Creatinine Equation (2021)    Anion gap 17 (H) 5 - 15    Comment: Performed at [facility], [address] [place], [place] [address]  Triglycerides     Status: Abnormal   Collection Time: 10/07/20  2:20 AM  Result Value Ref Range   Triglycerides 272 (H) <150 mg/dL    Comment: Performed at [facility], [address] [place], [place] [address]  Basic metabolic panel     Status: Abnormal   Collection Time: 10/07/20  2:20 AM  Result Value Ref Range   Sodium 139 135 - 145 mmol/L   Potassium 7.4 (HH) 3.5 - 5.1 mmol/L    Comment: NO VISIBLE HEMOLYSIS CRITICAL RESULT CALLED TO, READ BACK BY AND VERIFIED WITH: SHAY, RN @ 0310 ON 10/07/20 C VARNER    Chloride 108 98 - 111 mmol/L  CO2 14 (L) 22 - 32 mmol/L   Glucose, Bld 184 (H) 70 - 99 mg/dL    Comment: Glucose reference range applies only to samples taken after fasting for at least 8 hours.   BUN 25 (H) 6 - 20 mg/dL   Creatinine, Ser 4.09 (H) 0.44 - 1.00 mg/dL    Comment: DELTA CHECK NOTED   Calcium 5.9 (LL) 8.9 - 10.3 mg/dL    Comment: CRITICAL RESULT CALLED TO, READ BACK BY AND VERIFIED WITH: SHAY, RN @ 0310 ON 10/07/20 C VARNER    GFR, Estimated 21 (L) >60 mL/min    Comment: (NOTE) Calculated using the CKD-EPI Creatinine Equation (2021)    Anion gap 17 (H) 5 - 15    Comment: Performed at [facility], [address], [place], [place] [address]  Renal function panel (daily at 0500)     Status: Abnormal   Collection Time: 10/07/20  2:29 AM  Result Value Ref Range   Sodium 140 135 - 145 mmol/L   Potassium 7.2 (HH) 3.5 - 5.1 mmol/L    Comment: NO VISIBLE HEMOLYSIS CRITICAL RESULT CALLED TO, READ BACK BY AND VERIFIED WITH: SHAY, RN @ [phone] ON 10/07/20 C VARNER    Chloride 109 98 - 111 mmol/L   CO2 10 (L) 22 - 32 mmol/L   Glucose, Bld 176 (H) 70 - 99 mg/dL    Comment: Glucose reference  range applies only to samples taken after fasting for at least 8 hours.   BUN 24 (H) 6 - 20 mg/dL   Creatinine, Ser 9.56 (H) 0.44 - 1.00 mg/dL   Calcium 6.1 (LL) 8.9 - 10.3 mg/dL    Comment: CRITICAL RESULT CALLED TO, READ BACK BY AND VERIFIED WITH: SHAY, RN @ [phone] ON 10/07/20 C VARNER    Phosphorus 4.6 2.5 - 4.6 mg/dL   Albumin 3.5 3.5 - 5.0 g/dL   GFR, Estimated 22 (L) >60 mL/min    Comment: (NOTE) Calculated using the CKD-EPI Creatinine Equation (2021)    Anion gap 21 (H) 5 - 15    Comment: Performed at [facility], [address], [place], [place] [address]  Glucose, capillary     Status: Abnormal   Collection Time: 10/07/20  2:33 AM  Result Value Ref Range   Glucose-Capillary 260 (H) 70 - 99 mg/dL    Comment: Glucose reference range applies only to samples taken after fasting for at least 8 hours.  Glucose, capillary     Status: Abnormal   Collection Time: 10/07/20  4:01 AM  Result Value Ref Range   Glucose-Capillary 224 (H) 70 - 99 mg/dL    Comment: Glucose reference range applies only to samples taken after fasting for at least 8 hours.  Blood gas, arterial     Status: Abnormal   Collection Time: 10/07/20  5:02 AM  Result Value Ref Range   FIO2 100.00    pH, Arterial 7.142 (LL) 7.35 - 7.45    Comment: CRITICAL RESULT CALLED TO, READ BACK BY AND VERIFIED WITH: RN CHELSEA AT 0510 10/07/20 CRUICKSHANK A    pCO2 arterial 43.2 32 - 48 mmHg   pO2, Arterial 120 (H) 83 - 108 mmHg   Bicarbonate 13.8 (L) 20.0 - 28.0 mmol/L   Acid-base deficit 13.6 (H) 0.0 - 2.0 mmol/L   O2 Saturation 96.8 %   Patient temperature 101.1    Allens test (pass/fail) PASS PASS    Comment: Performed at [facility], [address] [place], [place] [address]  Glucose, capillary     Status: Abnormal   Collection Time: 10/07/20  5:04 AM  Result Value Ref Range   Glucose-Capillary 292 (H) 70 - 99 mg/dL    Comment: Glucose reference range applies only to samples  taken after fasting for at least 8 hours.   Comment 1 Notify RN    Comment 2 Document in Chart   Potassium     Status: Abnormal   Collection Time: 10/07/20  5:59 AM  Result Value Ref Range   Potassium 7.2 (HH) 3.5 - 5.1 mmol/L    Comment: REPEATED TO VERIFY NO VISIBLE HEMOLYSIS ICTERIC SPECIMEN CRITICAL RESULT CALLED TO, READ BACK BY AND VERIFIED WITH: BLACKBURN,B. RN AT 0725 10/07/20 MULLINS,T Performed at [facility], [address], [place], [place] [address]   Renal function panel     Status: Abnormal   Collection Time: 10/07/20  5:59 AM  Result Value Ref Range   Sodium 135 135 - 145 mmol/L   Potassium 7.2 (HH) 3.5 - 5.1 mmol/L    Comment: REPEATED TO VERIFY NO VISIBLE HEMOLYSIS ICTERIC SPECIMEN CRITICAL RESULT CALLED TO, READ BACK BY AND VERIFIED WITH: BLACKBURN,B. RN AT 0725 10/07/20 MULLINS,T    Chloride 108 98 - 111 mmol/L   CO2 16 (L) 22 - 32 mmol/L   Glucose, Bld 276 (H) 70 - 99 mg/dL    Comment: Glucose reference range applies only to samples taken after fasting for at least 8 hours.   BUN 29 (H) 6 - 20 mg/dL   Creatinine, Ser 2.95 (H) 0.44 - 1.00 mg/dL   Calcium 5.8 (LL) 8.9 - 10.3 mg/dL    Comment: CRITICAL RESULT CALLED TO, READ BACK BY AND VERIFIED WITH: BLACKBURN,B. RN AT 6213 10/07/20 MULLINS,T    Phosphorus 4.2 2.5 - 4.6 mg/dL   Albumin 3.0 (L) 3.5 - 5.0 g/dL   GFR, Estimated 17 (L) >60 mL/min    Comment: (NOTE) Calculated using the CKD-EPI Creatinine Equation (2021)    Anion gap 11 5 - 15    Comment: Performed at [facility], [address] [place], [place] [address]  CBC     Status: Abnormal   Collection Time: 10/07/20  5:59 AM  Result Value Ref Range   WBC 32.6 (H) 4.0 - 10.5 K/uL   RBC 3.54 (L) 3.87 - 5.11 MIL/uL   Hemoglobin 9.4 (L) 12.0 - 15.0 g/dL   HCT 84.6 (L) 36 - 46 %   MCV 85.9 80.0 - 100.0 fL   MCH 26.6 26.0 - 34.0 pg   MCHC 30.9 30.0 - 36.0 g/dL   RDW 96.2 (H) 95.2 - 84.1 %   Platelets 198  150 - 400 K/uL   nRBC 0.3 (H) 0.0 - 0.2 %    Comment: Performed at [facility], [address] [place], [place] [address]  Hepatic function panel     Status: Abnormal   Collection Time: 10/07/20  5:59 AM  Result Value Ref Range   Total Protein 5.0 (L) 6.5 - 8.1 g/dL   Albumin 2.9 (L) 3.5 - 5.0 g/dL   AST 1,027 (H) 15 - 41 U/L    Comment: RESULTS CONFIRMED BY MANUAL DILUTION   ALT 922 (H) 0 - 44 U/L   Alkaline Phosphatase 29 (L) 38 - 126 U/L   Total Bilirubin 1.3 (H) 0.3 - 1.2 mg/dL    Comment: ICTERIC SPECIMEN   Bilirubin, Direct 0.6 (H) 0.0 - 0.2 mg/dL  Indirect Bilirubin 0.7 0.3 - 0.9 mg/dL    Comment: Performed at [facility], [address], [place], [place] [address]  Magnesium     Status: Abnormal   Collection Time: 10/07/20  5:59 AM  Result Value Ref Range   Magnesium 1.5 (L) 1.7 - 2.4 mg/dL    Comment: Performed at [facility], [address], [place], [place] [address]  Draw ABG 1 hour after initiation of ventilator     Status: Abnormal   Collection Time: 10/07/20  6:25 AM  Result Value Ref Range   FIO2 100.00    Delivery systems VENTILATOR    Mode PRESSURE REGULATED VOLUME CONTROL    VT 380 mL   LHR 28.0 resp/min   Peep/cpap 10.0 cm H20   pH, Arterial 7.159 (LL) 7.35 - 7.45    Comment: CRITICAL RESULT CALLED TO, READ BACK BY AND VERIFIED WITH: HEVNER,A. RN AT 8185 10/07/20 MULLINS,T    pCO2 arterial 46.2 32 - 48 mmHg   pO2, Arterial 124 (H) 83 - 108 mmHg   Bicarbonate 15.4 (L) 20.0 - 28.0 mmol/L   Acid-base deficit 11.9 (H) 0.0 - 2.0 mmol/L   O2 Saturation 97.4 %   Patient temperature 38.4    Collection site A-LINE DRAW    Drawn by [id number]     Comment: Performed at [facility], [address] [place], [place] [address]  Glucose, capillary     Status: Abnormal   Collection Time: 10/07/20  6:28 AM  Result Value Ref Range   Glucose-Capillary 226 (H) 70 - 99 mg/dL    Comment:  Glucose reference range applies only to samples taken after fasting for at least 8 hours.  Glucose, capillary     Status: Abnormal   Collection Time: 10/07/20  7:44 AM  Result Value Ref Range   Glucose-Capillary 264 (H) 70 - 99 mg/dL    Comment: Glucose reference range applies only to samples taken after fasting for at least 8 hours.   Comment 1 Notify RN    Comment 2 Document in Chart   Na and K (sodium & potassium), rand urine     Status: None   Collection Time: 10/07/20  9:10 AM  Result Value Ref Range   Sodium, Ur 64 mmol/L   Potassium Urine 58 mmol/L    Comment: Performed at [facility], [address], [place], [place] [address]  Blood gas, arterial     Status: Abnormal   Collection Time: 10/07/20 11:19 AM  Result Value Ref Range   FIO2 60.00    Delivery systems VENTILATOR    Mode PRESSURE REGULATED VOLUME CONTROL    VT 380 mL   LHR 30.0 resp/min   Peep/cpap 8.0 cm H20   pH, Arterial 7.246 (L) 7.35 - 7.45   pCO2 arterial 49.6 (H) 32 - 48 mmHg   pO2, Arterial 105 83 - 108 mmHg   Bicarbonate 20.8 20.0 - 28.0 mmol/L   Acid-base deficit 5.9 (H) 0.0 - 2.0 mmol/L   O2 Saturation 96.9 %   Patient temperature 98.8    Collection site A-LINE DRAW    Drawn by [id number]    Allens test (pass/fail) PASS PASS    Comment: Performed at [facility], [address] [place], [place] [address]  Glucose, capillary     Status: Abnormal   Collection Time: 10/07/20 11:48 AM  Result Value Ref Range   Glucose-Capillary 210 (H) 70 - 99 mg/dL  Comment: Glucose reference range applies only to samples taken after fasting for at least 8 hours.   Comment 1 Notify RN    Comment 2 Document in Chart    DG Chest 2 View  Result Date: 09/19/2020 CLINICAL DATA:  Chest pain EXAM: CHEST - 2 VIEW COMPARISON:  None. FINDINGS: The heart size and mediastinal contours are within normal limits. Both lungs are clear. The visualized skeletal structures are unremarkable. Low  lung volumes IMPRESSION: No active cardiopulmonary disease. Electronically Signed   By: Jasmine Pang M.D.   On: 09/27/2020 22:19   US Abdomen Complete  Result Date: 10/06/2020 CLINICAL DATA:  Abdominal pain. EXAM: ABDOMEN ULTRASOUND COMPLETE COMPARISON:  CT abdomen and pelvis 10/06/2020 FINDINGS: Gallbladder: No gallstones or wall thickening visualized. No sonographic Murphy sign noted by sonographer. Common bile duct: Diameter: 5 mm, normal Liver: Diffusely increased parenchymal echotexture. Consistent with fatty infiltration. No focal lesions identified. Portal vein is patent on color Doppler imaging with normal direction of blood flow towards the liver. IVC: Limited visualization.  Visualized segments appear normal. Pancreas: Enlarged hypoechoic appearance to the pancreas with peripancreatic fluid consistent with acute pancreatitis, better seen on previous CT. No pancreatic ductal dilatation identified. Spleen: Size and appearance within normal limits. Right Kidney: Length: 9.3 cm. Echogenicity within normal limits. No mass or hydronephrosis visualized. Left Kidney: Length: 10.3 cm. Echogenicity within normal limits. No mass or hydronephrosis visualized. Abdominal aorta: Visualized portions demonstrate normal caliber. Other findings: Small amount of free fluid around the liver and spleen. IMPRESSION: 1. No evidence of cholelithiasis or cholecystitis. 2. Enlarged hypoechoic appearance to the pancreas consistent with acute pancreatitis as seen on previous CT. Small amount of free fluid around the liver and spleen. 3. Diffuse fatty infiltration of the liver. Electronically Signed   By: Burman Nieves M.D.   On: 10/06/2020 05:53   CT ABDOMEN PELVIS W CONTRAST  Result Date: 10/06/2020 CLINICAL DATA:  19 year old female with abdominal pain. Nausea and vomiting. EXAM: CT ABDOMEN AND PELVIS WITH CONTRAST TECHNIQUE: Multidetector CT imaging of the abdomen and pelvis was performed using the standard protocol  following bolus administration of intravenous contrast. CONTRAST:  OMNIPAQUE IOHEXOL 300 MG/ML  SOLN COMPARISON:  None. FINDINGS: Lower chest: Trace bilateral pleural effusions. The visualized lung bases are otherwise clear. No intra-abdominal free air.  Small ascites. Hepatobiliary: Apparent mild fatty liver. No intrahepatic biliary ductal dilatation. The gallbladder is unremarkable. Pancreas: There is diffuse inflammatory changes of the pancreas with peripancreatic fluid and edema consistent with acute pancreatitis. No drainable fluid collection/abscess or pseudocyst. No dilatation of the main pancreatic duct. Spleen: Normal in size without focal abnormality. Adrenals/Urinary Tract: The adrenal glands unremarkable. The kidneys, visualized ureters, and urinary bladder appear unremarkable. Stomach/Bowel: There is no bowel obstruction or active inflammation. The appendix is normal. Vascular/Lymphatic: The abdominal aorta an IVC unremarkable. The SMV, splenic vein, and main portal vein are patent. No portal venous gas. There is no adenopathy. Reproductive: The uterus is anteverted and grossly unremarkable. No adnexal masses. Other: None Musculoskeletal: No acute or significant osseous findings. IMPRESSION: 1. Acute pancreatitis. No abscess or pseudocyst. 2. Small ascites. 3. Trace bilateral pleural effusions. 4. No bowel obstruction. Normal appendix. Electronically Signed   By: Elgie Collard M.D.   On: 10/06/2020 01:37   DG Chest Port 1 View  Result Date: 10/07/2020 CLINICAL DATA:  Respiratory failure EXAM: PORTABLE CHEST 1 VIEW COMPARISON:  Earlier today FINDINGS: A dialysis catheter has been placed on the right with tip at the  upper cavoatrial junction. Left IJ line with tip at the lower right atrium. Enteric tube tip at the clavicular heads. Orogastric tube as seen on comparison abdominal radiograph. Stable low volume chest with atelectasis. No air leak seen. Normal heart size IMPRESSION: 1. New  dialysis catheter with tip at the upper cavoatrial junction. 2. Left IJ line with tip at the lower right atrium. 3. Stable low volume lungs.  No pneumothorax. Electronically Signed   By: Marnee Spring M.D.   On: 10/07/2020 06:01   DG Chest Port 1 View  Result Date: 10/07/2020 CLINICAL DATA:  Intubation and central line placement EXAM: PORTABLE CHEST 1 VIEW COMPARISON:  October 08, 2020 FINDINGS: Endotracheal tube placement with tip measuring 1.4 cm above the carina. Left central venous catheter placed with tip over the right atrium. Cardiac enlargement. Shallow inspiration with atelectasis in the lung bases. No pleural effusions. No pneumothorax. Mediastinal contours appear intact. IMPRESSION: Appliances as above. Cardiac enlargement. Shallow inspiration with atelectasis in the lung bases. Electronically Signed   By: Burman Nieves M.D.   On: 10/07/2020 04:22   DG Abd Portable 1V  Result Date: 10/07/2020 CLINICAL DATA:  Orogastric tube placement EXAM: PORTABLE ABDOMEN - 1 VIEW COMPARISON:  None. FINDINGS: Orogastric tube tip at the mid duodenum with side port near the bulb. Relative paucity of abdominal bowel gas. No concerning mass effect or gas collection IMPRESSION: Orogastric tube with tip and side port at the duodenum. Electronically Signed   By: Marnee Spring M.D.   On: 10/07/2020 05:59   ECHOCARDIOGRAM COMPLETE  Result Date: 10/07/2020    ECHOCARDIOGRAM REPORT   Patient Name:   Heather Lynn Date of Exam: 10/07/2020 Medical Rec #:  [id number]     Height:       64.0 in Accession #:    [id number]    Weight:       185.0 lb Date of Birth:  09/23/2001      BSA:          1.892 m Patient Age:    19 years      BP:           87/46 mmHg Patient Gender: F             HR:           103 bpm. Exam Location:  Inpatient Procedure: 2D Echo, Cardiac Doppler, Color Doppler and Intracardiac            Opacification Agent Indications:    R94.31 Abnormal EKG  History:        Patient has no prior history of  Echocardiogram examinations.  Sonographer:    Eulah Pont RDCS Referring Phys: [id number] Gardiner Ramus MATHEWS IMPRESSIONS  1. Left ventricular ejection fraction, by estimation, is 60 to 65%. The left ventricle has normal function. The left ventricle has no regional wall motion abnormalities. Left ventricular diastolic parameters are consistent with Grade I diastolic dysfunction (impaired relaxation).  2. Right ventricular systolic function is normal. The right ventricular size is normal. Tricuspid regurgitation signal is inadequate for assessing PA pressure.  3. The mitral valve is normal in structure. No evidence of mitral valve regurgitation. No evidence of mitral stenosis.  4. The aortic valve is normal in structure. Aortic valve regurgitation is not visualized. No aortic stenosis is present. FINDINGS  Left Ventricle: Left ventricular ejection fraction, by estimation, is 60 to 65%. The left ventricle has normal function. The left ventricle has no regional wall motion abnormalities. Definity contrast agent was given  IV to delineate the left ventricular  endocardial borders. The left ventricular internal cavity size was normal in size. There is no left ventricular hypertrophy. Left ventricular diastolic parameters are consistent with Grade I diastolic dysfunction (impaired relaxation). Normal left ventricular filling pressure. Right Ventricle: The right ventricular size is normal. No increase in right ventricular wall thickness. Right ventricular systolic function is normal. Tricuspid regurgitation signal is inadequate for assessing PA pressure. Left Atrium: Left atrial size was normal in size. Right Atrium: Right atrial size was normal in size. Pericardium: There is no evidence of pericardial effusion. Mitral Valve: The mitral valve is normal in structure. No evidence of mitral valve regurgitation. No evidence of mitral valve stenosis. Tricuspid Valve: The tricuspid valve is normal in structure. Tricuspid valve  regurgitation is trivial. No evidence of tricuspid stenosis. Aortic Valve: The aortic valve is normal in structure. Aortic valve regurgitation is not visualized. No aortic stenosis is present. Pulmonic Valve: The pulmonic valve was normal in structure. Pulmonic valve regurgitation is trivial. No evidence of pulmonic stenosis. Aorta: The aortic root is normal in size and structure. Venous: The inferior vena cava was not well visualized. IAS/Shunts: No atrial level shunt detected by color flow Doppler.  LEFT VENTRICLE PLAX 2D LVIDd:         3.20 cm  Diastology LVIDs:         2.30 cm  LV e' medial:    4.43 cm/s LV PW:         1.00 cm  LV E/e' medial:  9.4 LV IVS:        0.90 cm  LV e' lateral:   6.15 cm/s LVOT diam:     1.70 cm  LV E/e' lateral: 6.8 LV SV:         35 LV SV Index:   18 LVOT Area:     2.27 cm  RIGHT VENTRICLE RV S prime:     14.80 cm/s TAPSE (M-mode): 1.4 cm LEFT ATRIUM             Index      RIGHT ATRIUM          Index LA diam:        2.90 cm 1.53 cm/m RA Area:     5.79 cm LA Vol (A2C):   18.6 ml 9.83 ml/m RA Volume:   8.68 ml  4.59 ml/m LA Vol (A4C):   16.5 ml 8.72 ml/m LA Biplane Vol: 18.3 ml 9.67 ml/m  AORTIC VALVE LVOT Vmax:   109.00 cm/s LVOT Vmean:  70.700 cm/s LVOT VTI:    0.153 m  AORTA Ao Root diam: 2.50 cm Ao Asc diam:  2.00 cm MITRAL VALVE MV Area (PHT): 1.36 cm    SHUNTS MV Decel Time: 556 msec    Systemic VTI:  0.15 m MV E velocity: 41.80 cm/s  Systemic Diam: 1.70 cm MV A velocity: 59.10 cm/s MV E/A ratio:  0.71 Gloris Manchester Turner MD Electronically signed by Armanda Magic MD Signature Date/Time: 10/07/2020/1:39:38 PM    Final       Assessment/Plan Hx of anxiety/depression Acute renal failure - CRRT per nephrology and CCM VDRF - vent per CCM   Severe acute pancreatitis with shock and lactic acidosis Patient with severe acute pancreatitis of unclear etiology, being worked up for autoimmune source. Currently abdomen is tight but I do not feel that she has an abdominal compartment  syndrome. Continue to monitor bladder pressures, if increasing - may need to paralyze and recheck to get  a more accurate reading. We will follow closely but hopefully abdominal pressure will improve with CRRT. Complicated given pressor and fluid requirements. If patient were to require operative intervention for abdominal compartment syndrome, prognosis seems very poor - would recommend palliative discussions at that point so that we are very clear on GOC.   FEN: NPO, IVF, NGT to LIWS VTE: SQ heparin ID: [id number] 10/22>>  Juliet Rude, [facility] Surgery 10/07/2020, 2:11 PM Please see Amion for pager number during day hours 7:00am-4:30pm

## 2020-10-07 NOTE — Progress Notes (Signed)
CRITICAL VALUE ALERT  Critical Value:  Calcium 4.7  Date & Time Notied:  10/07/2020 1445  Provider Notified: [place]  Orders Received/Actions taken: Replacing IV.

## 2020-10-07 NOTE — Progress Notes (Addendum)
eLink Physician-Brief Progress Note Patient Name: Heather Lynn DOB: Jan 31, 2001 MRN: [id number]   Date of Service  10/07/2020  HPI/Events of Note  K+ 6.6, patient with respiratory rate in the 40's, likely exacerbated by splinting respirations due to pancreatitis induced abdominal pain. Patient is very nauseated so BIPAP is not an option, Zofran discontinued due to QTc of 560. Ca++ 0.78 so 4 gm of calcium gluconate ordered.  eICU Interventions  See above. Dr. Craige Cotta requested to see patient and re-evaluate regarding possible need for intubation. 100 % non re-breather mask ordered.        [name] 10/07/2020, 2:34 AM

## 2020-10-07 NOTE — Procedures (Signed)
Intubation Procedure Note  Kaede Clendenen  [id number]  03/06/2001  Date:10/07/20  Time:3:52 AM   Provider Performing:Jefte Carithers    Procedure: Intubation (31500)  Indication(s) Respiratory Failure  Consent Risks of the procedure as well as the alternatives and risks of each were explained to the patient and/or caregiver.  Consent for the procedure was obtained and is signed in the bedside chart   Anesthesia Etomidate 20 mg, Versed 4 mg, Fentanyl 200 mcg and Rocuronium 70 mg.   Time Out Verified patient identification, verified procedure, site/side was marked, verified correct patient position, special equipment/implants available, medications/allergies/relevant history reviewed, required imaging and test results available.   Sterile Technique Usual hand hygeine, masks, and gloves were used   Procedure Description Patient positioned in bed supine.  Sedation given as noted above.  Patient was intubated with endotracheal tube using Glidescope.  View was Grade 1 full glottis with anterior positioning.  Number of attempts was 2.  Colorimetric CO2 detector was consistent with tracheal placement.  Inserted #7 ETT to 23 cm at lip.  Confirmed with ausculation also.   Complications/Tolerance Transient oxygen desaturation. Chest X-ray is ordered to verify placement.  Coralyn Helling, MD [facility] Pulmonary/Critical Care Pager - [phone] 10/07/2020, 3:53 AM

## 2020-10-07 NOTE — Progress Notes (Signed)
Pharmacy Antibiotic Note  Heather Lynn is a 19 y.o. female admitted on 10/15/2020 with pancreatitis.  Pharmacy has been consulted for zosyn dosing. Pt intubated, on CRRT,  on pressor, Neo>> levophed Tm 102, WBC 32.6  Plan: Zosyn 3.375 gm IV q6h - infuse each dose over 30 minutes- CRRT dose F/u renal status, WBC, temp, culture data  Height: 5' 4.02" (162.6 cm) Weight: 83.9 kg (184 lb 15.5 oz) IBW/kg (Calculated) : 54.74  Temp (24hrs), Avg:101 F (38.3 C), Min:97.5 F (36.4 C), Max:102 F (38.9 C)  Recent Labs  Lab 09/24/2020 2203 09/21/2020 2203 10/06/20 0744 10/06/20 1427 10/06/20 1814 10/06/20 2108 10/07/20 0220 10/07/20 0229 10/07/20 0559  WBC 21.8*  --  40.3* 49.1*  --   --  40.5*  --  32.6*  CREATININE 0.75   < > 0.78 1.44*  --   --  3.02*  3.09* 3.04* 3.65*  LATICACIDVEN  --   --   --   --  10.9* 9.2*  --   --   --    < > = values in this interval not displayed.    Estimated Creatinine Clearance: 25.8 mL/min (A) (by C-G formula based on SCr of 3.65 mg/dL (H)).    Allergies  Allergen Reactions  . Lamictal [Lamotrigine]     Severe anger   Antimicrobials this admission:  10/21 zosyn x 2 doses; 10/22>> 10/21 meropenem>10/22 Dose adjustments this admission:   Microbiology results:  10/21 covid/flu neg 10/21 BCx1: ngtd 10/21 HIV NR 10/21 MRSA neg  Thank you for allowing pharmacy to be a part of this patient's care.  Herby Abraham, Pharm.D 10/07/2020 9:46 AM

## 2020-10-07 NOTE — Progress Notes (Signed)
PCCM progress note  Rising pressor requirement through day. Continues CRRT. Belly distended. Backed off on MIVF. Bladder pressures mid 20s. TTE does not show under filled LV. 5.3L IVF today, 3L suctioned out from gastric tube.   Noticed new pulse pressure variation on a-line ~6 pm. Bolus 1L LR late evening. Follow and bolus as indicated especially if pressor requirement improves.  Had meeting with mother and husband at bedside. Discussed grave prognosis and very high mortality given her multiorgan failure in setting of acute pancreatitis. understandably upset and distraught. I fear the worst and attempted to prepare them for this. Will continue to improve what we can and support her body the best we can.

## 2020-10-07 NOTE — Progress Notes (Signed)
  Echocardiogram 2D Echocardiogram has been performed.  Augustine Radar 10/07/2020, 12:19 PM

## 2020-10-07 NOTE — Procedures (Signed)
Central Venous Catheter Insertion Procedure Note  Heather Lynn  [id number]  06-25-2001  Date:10/07/20  Time:5:43 AM   Provider Performing:Syrus Nakama   Procedure: Insertion of Non-tunneled Central Venous Catheter(36556)with US guidance (53976)    Indication(s) Hemodialysis  Consent Risks of the procedure as well as the alternatives and risks of each were explained to the patient and/or caregiver.  Consent for the procedure was obtained and is signed in the bedside chart  Anesthesia Topical only with 1% lidocaine   Timeout Verified patient identification, verified procedure, site/side was marked, verified correct patient position, special equipment/implants available, medications/allergies/relevant history reviewed, required imaging and test results available.  Sterile Technique Maximal sterile technique including full sterile barrier drape, hand hygiene, sterile gown, sterile gloves, mask, hair covering, sterile ultrasound probe cover (if used).  Procedure Description Area of catheter insertion was cleaned with chlorhexidine and draped in sterile fashion.   With real-time ultrasound guidance a HD catheter was placed into the right internal jugular vein.  Nonpulsatile blood flow and easy flushing noted in all ports.  The catheter was sutured in place and sterile dressing applied.  Complications/Tolerance None; patient tolerated the procedure well. Chest X-ray is ordered to verify placement for internal jugular cannulation.  Coralyn Helling, MD [facility] Pulmonary/Critical Care Pager - [phone] 10/07/2020, 5:43 AM

## 2020-10-07 NOTE — Progress Notes (Signed)
Initial Nutrition Assessment  DOCUMENTATION CODES:   Obesity unspecified  INTERVENTION:  - will monitor for plan concerning nutrition support.  NUTRITION DIAGNOSIS:   Inadequate oral intake related to inability to eat as evidenced by NPO status.  GOAL:   Provide needs based on ASPEN/SCCM guidelines  MONITOR:   Vent status, Labs, Weight trends  REASON FOR ASSESSMENT:   Ventilator, Consult Enteral/tube feeding initiation and management  ASSESSMENT:   19 y.o. female with history of depression. She presented to the ED with abdominal pain x24 hours and associated N/V. Pain was mainly epigastric but radiating to the back to some extent.  Patient's husband was at the bedside at the time of RD visit. He states that patient's appetite was at baseline although they had been smoking more marijuana recently and eating more than usual due to this. He denies patient having abdominal pain or N/V prior to the day PTA. He states that onset of pain was in the chest area and then progressed to include her abdomen and back.   Patient was discussed in rounds. Plan to hold on TF initiation at this time. Family meeting with husband and mom occurring at this time.   Patient was intubated overnight and OGT placed at that time. Abdominal xray report states that tip of tube is "at the duodenum." OGT to LIS with 150-175 ml dark green-brown output at this time.   Patient started on CRRT overnight.  Weight today is 185 lb and no other weight is recorded in the chart.   Per notes: - severe acute pancreatitis without evidenced of gallstones - SIRS/sepsis - AKI from ATN - hyperkalemia    Patient is currently intubated on ventilator support MV: 10.6 L/min Temp (24hrs), Avg:101 F (38.3 C), Min:97.5 F (36.4 C), Max:102 F (38.9 C) Propofol: none BP: 105/65 and MAP: 73  Labs reviewed; CBGs: 224-292 mg/dl, K: 7.2 mmol/l, BUN: 29 mg/dl, creatinine: 1.19 mg/dl, Ca: 5.8 mg/dl, Mg: 1.5 mg/dl, LFTs  elevated, GFR: 17 ml/min.  Medications reviewed; sliding scale novolog, 10 units novolog x1 dose, 5 units novolog x1 dose,  IVF; NS @ 200 ml/hr Drips; neo @ 60 mcg/min, fentanyl @ 150 mcg/hr, levo @ 16 mcg/min, versed @ 4 mg/hr.    Diet Order:   Diet Order            Diet NPO time specified  Diet effective now                 EDUCATION NEEDS:   No education needs have been identified at this time  Skin:  Skin Assessment: Reviewed RN Assessment  Last BM:  10/21 (?)  Height:   Ht Readings from Last 1 Encounters:  10/07/20 5' 4.02" (1.626 m)    Weight:   Wt Readings from Last 1 Encounters:  10/07/20 83.9 kg     Estimated Nutritional Needs:  Kcal:  2144 kcal Protein:  134-151 grams (1.6-1.8 grams/kg actual weight) Fluid:  >/= 2.5 L/day     Trenton Gammon, MS, RD, LDN, CNSC Inpatient Clinical Dietitian RD pager # available in [facility]  After hours/weekend pager # available in [facility]

## 2020-10-07 NOTE — Consult Note (Signed)
[facility] for Infectious Disease    Date of Admission:  10/09/2020     Reason for Consult: septic shock, pancreatitis    Referring Provider: Hunsucker    Lines:  10/21-c left triple lumen cvc 10/21-c peripherals 10/21-c foley catheter 10/21-c ETT  Abx: 10/22-c piptazo  10/21-22 meropenem        Assessment: 19 yo female admitted 10/21 with septic shock in setting severe pancreatitis, course complicated by aki requiring dialysis and respiratory failure requiring mechanical ventillation  So far no hx/prodrome of an infectious etiology; for pancreatitis mostly if ID related is viral. Hiv/flu negative. No prodrome to suggest an infectious syndrome causing pancreatitis. Likely severe pancreatitis with sepsis physiology, but ongoing workup  Of note, spirochete/tick related disease can cause lft elevation (which in her case has many confounding factors); in particular the spirochete induces alkaline phosphatase elevation much significantly than cellular pattern. Other tick causes more of a cellular pattern, but the lack of thrombocytopenia makes this unlikely  Given her age, reasonable to consider rheumatologic cause as well  Reasonable to continue empiric abx for now pending blood cx final report; if all negative can d/c abx. But should this be severe necrotizing pancreatitis, he is at risk for complication of intraabdominal infection in 2-4 weeks  Given severity of disease. Will send peripheral smear, tick serology, and throw in a few days of doxy  piptazo would be a good choice for a young previously healthy female without medial system exposure. There is no superiority of meropenem and we would like to avoid MDRO development, which will come with meropenem   ---- Today currently on 2 pressors, dialysis Surgery evaluating for intraabd compartment syndrome    Plan: 1. Stop meropenem 2. Start piptazo renal dosing  3. Add doxycycline 4. Send for ehrlichia,  hepatitis panel, RMSF serology 5. Send for ana, anca, lupus anticoagulant panels 6. F/u final blood cx 7. If blood cx remains negative by 10/24, can stop pip-tazo  Principal Problem:   Acute pancreatitis Active Problems:   Pancreatitis   Scheduled Meds: . chlorhexidine gluconate (MEDLINE KIT)  15 mL Mouth Rinse BID  . Chlorhexidine Gluconate Cloth  6 each Topical Daily  . etomidate      . feeding supplement (VITAL HIGH PROTEIN)  1,000 mL Per Tube Q24H  . heparin injection (subcutaneous)  5,000 Units Subcutaneous Q8H  . insulin aspart  0-15 Units Subcutaneous Q4H  . mouth rinse  15 mL Mouth Rinse 10 times per day  . pantoprazole (PROTONIX) IV  40 mg Intravenous Q24H  . succinylcholine      . vecuronium       Continuous Infusions: . sodium chloride 200 mL/hr at 10/07/20 0900  . sodium chloride    . fentaNYL infusion INTRAVENOUS 150 mcg/hr (10/07/20 0900)  . midazolam 4 mg/hr (10/07/20 0900)  . norepinephrine (LEVOPHED) Adult infusion    . phenylephrine (NEO-SYNEPHRINE) Adult infusion 100 mcg/min (10/07/20 0900)  . piperacillin-tazobactam (ZOSYN)  IV    . prismasol BGK 0/2.5 2,000 mL/hr at 10/07/20 0703  . sodium bicarbonate in D5W 1000 mL infusion 150 mL/hr at 10/07/20 0900  . sodium bicarbonate (isotonic) 1000 mL infusion 500 mL/hr at 10/07/20 0820  . sodium bicarbonate (isotonic) 1000 mL infusion 300 mL/hr at 10/07/20 0703   PRN Meds:.Place/Maintain arterial line **AND** sodium chloride, acetaminophen **OR** acetaminophen, docusate, fentaNYL, fentaNYL (SUBLIMAZE) injection, heparin, midazolam, polyethylene glycol, sodium chloride  HPI: Heather Lynn is a 19 y.o. female healthy  otherwise admitted for 1 day severe epigastric abd pain, n/v, found to have pancreatis    Hx limited due to patient's currently intubated Pain epigastric, acute onset, shooting straight to the back. Associated n/v. No f/c/uri sx, sob.  No rash My joint pain Some etoh use not heavy Denies drugs but  endorsed marijuanna occasionally No hx dm or lupus or other autoimmune disease No family hx cholesterol syndrome No new medications   Hospital course: Septic shock within hours in ED lft up to 2000s although minimal alt elevation, and rising; tbili minimally elevated lipiase up to 4400 Trig 180 Cr uptrendign to mid 3's Wbc up to 40 but down trending mild anemia but no thrombocytopenia abd ct c/w acute pancreatitis  On 2 pressors for hemodynamic support; intubated due to hemodynamic instability Also had developed fever to 102s Intubated, dialysis started    Review of Systems: ROS Unable to get due to patient's intubation  Past Medical History:  Diagnosis Date  . Anxiety     Social History   Tobacco Use  . Smoking status: Former Research scientist (life sciences)  . Smokeless tobacco: Never Used  Substance Use Topics  . Alcohol use: Not Currently  . Drug use: Not Currently    Family History  Problem Relation Age of Onset  . Alcohol abuse Mother   . Depression Mother   . Depression Half-Brother   . Alcohol abuse Half-Brother   . Post-traumatic stress disorder Half-Sister    Allergies  Allergen Reactions  . Lamictal [Lamotrigine]     Severe anger    OBJECTIVE: Blood pressure (!) 87/46, pulse (!) 110, temperature (!) 97.5 F (36.4 C), resp. rate (!) 28, height 5' 4.02" (1.626 m), weight 83.9 kg, last menstrual period 09/25/2020, SpO2 100 %.  Physical Exam Patient acutely ill appearing, on vent, and dialysis being run Obese Normocephalic; per; conj clear; ett intact Neck supple; left jvp present Tachy/regular no mrg Lungs clear on vent abd distended  Ext no edema Skin no rash Psych/Neuro unable to examine due to sedation  Foley/ngt/ett/left ij  Lab Results Lab Results  Component Value Date   WBC 32.6 (H) 10/07/2020   HGB 9.4 (L) 10/07/2020   HCT 30.4 (L) 10/07/2020   MCV 85.9 10/07/2020   PLT 198 10/07/2020    Lab Results  Component Value Date   CREATININE 3.65 (H)  10/07/2020   BUN 29 (H) 10/07/2020   NA 135 10/07/2020   K 7.2 (HH) 10/07/2020   K 7.2 (HH) 10/07/2020   CL 108 10/07/2020   CO2 16 (L) 10/07/2020    Lab Results  Component Value Date   ALT 922 (H) 10/07/2020   AST 2,382 (H) 10/07/2020   ALKPHOS 29 (L) 10/07/2020   BILITOT 1.3 (H) 10/07/2020     Microbiology: Recent Results (from the past 240 hour(s))  Respiratory Panel by RT PCR (Flu A&B, Covid) - Nasopharyngeal Swab     Status: None   Collection Time: 10/06/20  3:04 AM   Specimen: Nasopharyngeal Swab  Result Value Ref Range Status   SARS Coronavirus 2 by RT PCR NEGATIVE NEGATIVE Final    Comment: (NOTE) SARS-CoV-2 target nucleic acids are NOT DETECTED.  The SARS-CoV-2 RNA is generally detectable in upper respiratoy specimens during the acute phase of infection. The lowest concentration of SARS-CoV-2 viral copies this assay can detect is 131 copies/mL. A negative result does not preclude SARS-Cov-2 infection and should not be used as the sole basis for treatment or other patient management decisions. A negative  result may occur with  improper specimen collection/handling, submission of specimen other than nasopharyngeal swab, presence of viral mutation(s) within the areas targeted by this assay, and inadequate number of viral copies (<131 copies/mL). A negative result must be combined with clinical observations, patient history, and epidemiological information. The expected result is Negative.  Fact Sheet for Patients:  PinkCheek.be  Fact Sheet for Healthcare Providers:  GravelBags.it  This test is no t yet approved or cleared by the [place] FDA and  has been authorized for detection and/or diagnosis of SARS-CoV-2 by FDA under an Emergency Use Authorization (EUA). This EUA will remain  in effect (meaning this test can be used) for the duration of the COVID-19 declaration under Section 564(b)(1) of the  Act, 21 U.S.C. section 360bbb-3(b)(1), unless the authorization is terminated or revoked sooner.     Influenza A by PCR NEGATIVE NEGATIVE Final   Influenza B by PCR NEGATIVE NEGATIVE Final    Comment: (NOTE) The Xpert Xpress SARS-CoV-2/FLU/RSV assay is intended as an aid in  the diagnosis of influenza from Nasopharyngeal swab specimens and  should not be used as a sole basis for treatment. Nasal washings and  aspirates are unacceptable for Xpert Xpress SARS-CoV-2/FLU/RSV  testing.  Fact Sheet for Patients: PinkCheek.be  Fact Sheet for Healthcare Providers: GravelBags.it  This test is not yet approved or cleared by the [place] FDA and  has been authorized for detection and/or diagnosis of SARS-CoV-2 by  FDA under an Emergency Use Authorization (EUA). This EUA will remain  in effect (meaning this test can be used) for the duration of the  Covid-19 declaration under Section 564(b)(1) of the Act, 21  U.S.C. section 360bbb-3(b)(1), unless the authorization is  terminated or revoked. Performed at [facility], [place] [address], [place], [place] [address]   Culture, blood (single)     Status: None (Preliminary result)   Collection Time: 10/06/20  3:01 PM   Specimen: BLOOD  Result Value Ref Range Status   Specimen Description   Final    BLOOD LEFT ANTECUBITAL Performed at [facility] [address] [place], [place] [address]    Special Requests   Final    BOTTLES DRAWN AEROBIC AND ANAEROBIC Blood Culture adequate volume Performed at [facility] [address], [place], [place] [address]    Culture   Final    NO GROWTH < 12 HOURS Performed at [facility] [address] [place], [place] [address]    Report Status PENDING  Incomplete  MRSA PCR Screening     Status: None   Collection Time: 10/06/20 10:47 PM   Specimen: Nasal Mucosa; Nasopharyngeal    Result Value Ref Range Status   MRSA by PCR NEGATIVE NEGATIVE Final    Comment:        The GeneXpert MRSA Assay (FDA approved for NASAL specimens only), is one component of a comprehensive MRSA colonization surveillance program. It is not intended to diagnose MRSA infection nor to guide or monitor treatment for MRSA infections. Performed at [facility], [place] [address] [place], [place] [address]    Imaging: 10/21 abd/pelv ct with contrast 1. Acute pancreatitis. No abscess or pseudocyst. 2. Small ascites. 3. Trace bilateral pleural effusions. 4. No bowel obstruction. Normal appendix.   Jabier Mutton, [facility] for Infectious [place] [phone] pager    10/07/2020, 9:29 AM

## 2020-10-07 NOTE — Progress Notes (Addendum)
NAMEFryda Lynn, MRN:  [id number], DOB:  May 23, 2001, LOS: 1 ADMISSION DATE:  09/23/2020, CONSULTATION DATE:  10/07/20 REFERRING MD:  Coralyn Helling, MD CHIEF COMPLAINT:  Belly pain   Brief History   19 year old who presented to the ED with severe abdominal pain found to have acute idiopathic pancreatitis.  History of present illness   Patient had onset of belly pain about 1 to 2 days ago.  Is associated with nausea with vomiting.  She points to the top of her belly.  Does endorse some radiation to the back.  No shortness of breath.  Has developed a little bit of chest pain over the last few hours.  No fever or chills.  She drinks irregularly, had 3 white claws last week.  No significant binge drinking recently.  She does not do IV drugs.  Does smoke marijuana on occasion.  No obvious medication culprits for pancreatitis on her medication list.  In the ED, she has been intermittently tachycardic.  She is demonstrated saturations in the mid to high 90s on room air.  Her blood pressure has been okay.  Her hemoglobin and hematocrit have increased despite aggressive hydration.  Leukocytosis climbed 20,000-40,000.  Lipase 1300.  No hyperbilirubinemia.  LFTs mildly elevated.  Noted to have some fatty liver on imaging.  Triglycerides only 181.  She endorses good urine output with the immense amount of IV fluid she is receiving  Past Medical History  Anxiety  Significant Hospital Events   10/21 admit, change to ICU status, start pressors 10/22 Intubated, start ARDS protocol  Consults:  GI Nephrology  Procedures:  ETT 10/22 >> Lt IJ CVL 10/22 >> Arterial line 10/22 >>   Significant Diagnostic Tests:   CT abd/pelvis 10/21 >> trace effusions, small ascites, fatty liver, diffuse inflammatory changes of pancreas with peripancreatic fluid and edema  Abd u/s 10/21 >> no gallstones, CBD 5 mm  Micro Data:  COVID 10/21 >> negative Blood 10/21 >>  Antimicrobials:  Meropenem 10/21 >>    Interim history/subjective:  Increased abdominal and back pain.  More nauseous.  Increasing O2 needs and increased WOB.  Objective   Blood pressure 139/88, pulse (!) 138, temperature (!) 101.1 F (38.4 C), resp. rate (!) 54, height 5' 4.02" (1.626 m), weight 83.9 kg, last menstrual period 09/25/2020, SpO2 98 %.        Intake/Output Summary (Last 24 hours) at 10/07/2020 0355 Last data filed at 10/06/2020 2300 Gross per 24 hour  Intake 4700.75 ml  Output --  Net 4700.75 ml   Filed Weights   10/06/20 0040 10/07/20 0100  Weight: 83.9 kg 83.9 kg    Examination:  General - pale, anxious, using accessory muscles Eyes - pupils reactive ENT - no sinus tenderness, no stridor Cardiac - regular, tachycardic Chest - diminished breath sounds, poor air movement Abdomen - mild distention, decreased bowel sounds, tender in epigastrium Extremities - no cyanosis, clubbing, or edema Skin - no rashes Neuro - follows commands   Resolved Hospital Problem list     Assessment & Plan:   Severe acute pancreatitis. - no clear inciting event; ?if auto immune versus idiopathic - f/u IgG4 from 10/21 - f/u calcium, triglycerides - GI consulted >> no indication for procedures at this time - trickle tube feeds  SIRS/sepsis with lactic acidosis from acute pancreatitis. - continue volume resuscitation - pressors to keep MAP > 65 - f/u lactic acid - monitor CVP  Acute hypoxic respiratory failure in setting of pancreatitis. - required  intubation - ARDS protocol >> start with 7 cc/kg for Vt and f/u ABG - goal SpO2 90 to 95% - f/u CXR, ABG  AKI from ATN. Hyperkalemia. Anion gap metabolic acidosis. - baseline creatinine 0.75 from 10/07/2020 - nephrology consulted - will likely need to start CRRT  Anemia of critical illness. - f/u CBC - transfuse for Hb < 7 or significant bleeding  Hyperglycemia. - SSI  Sedation while on ventilator. Hx of anxiety. - versed, fentanyl gtt for RASS  goal -2 to -3 - hold outpt klonopin, seroquel, prozac  Best practice:  Diet: trickle tube feeds DVT prophylaxis: lovenox SUP: protonix Mobility: bed rest Family: called pt's husband to update but got voicemail, and left message Disposition: ICU  Labs    CMP Latest Ref Rng & Units 10/07/2020 10/07/2020 10/07/2020  Glucose 70 - 99 mg/dL 841(Y) 606(T) -  BUN 6 - 20 mg/dL 01(S) 01(U) -  Creatinine 0.44 - 1.00 mg/dL 9.32(T) 5.57(D) -  Sodium 135 - 145 mmol/L 138 139 138  Potassium 3.5 - 5.1 mmol/L 7.2(HH) 7.4(HH) 6.6(HH)  Chloride 98 - 111 mmol/L 108 108 -  CO2 22 - 32 mmol/L 13(L) 14(L) -  Calcium 8.9 - 10.3 mg/dL 2.2(GU) 5.9(LL) -  Total Protein 6.5 - 8.1 g/dL 5.9(L) - -  Total Bilirubin 0.3 - 1.2 mg/dL 5.4(Y) - -  Alkaline Phos 38 - 126 U/L 32(L) - -  AST 15 - 41 U/L 1,354(H) - -  ALT 0 - 44 U/L 638(H) - -    CBC Latest Ref Rng & Units 10/07/2020 10/07/2020 10/06/2020  WBC 4.0 - 10.5 K/uL 40.5(H) - 49.1(H)  Hemoglobin 12.0 - 15.0 g/dL 10.1(L) 10.2(L) 14.6  Hematocrit 36 - 46 % 34.7(L) 30.0(L) 50.3(H)  Platelets 150 - 400 K/uL 296 - 512(H)    ABG    Component Value Date/Time   PHART 7.185 (LL) 10/07/2020 0148   PCO2ART 34.7 10/07/2020 0148   PO2ART 72 (L) 10/07/2020 0148   HCO3 12.9 (L) 10/07/2020 0148   TCO2 14 (L) 10/07/2020 0148   ACIDBASEDEF 14.0 (H) 10/07/2020 0148   O2SAT 89.0 10/07/2020 0148    CBG (last 3)  Recent Labs    10/06/20 1953 10/06/20 2326 10/07/20 0233  GLUCAP 295* 172* 260*    Critical care time: 48 minutes independent of procedure time.   Coralyn Helling, MD [facility] Pulmonary/Critical Care Pager - [phone] 10/07/2020, 3:55 AM

## 2020-10-07 NOTE — Procedures (Signed)
Central Venous Catheter Insertion Procedure Note  Heather Lynn  [id number]  2001/10/15  Date:10/07/20  Time:3:54 AM   Provider Performing:Grisell Bissette   Procedure: Insertion of Non-tunneled Central Venous [phone]) with US guidance (10071)   Indication(s) Medication administration  Consent Risks of the procedure as well as the alternatives and risks of each were explained to the patient and/or caregiver.  Consent for the procedure was obtained and is signed in the bedside chart  Anesthesia Topical only with 1% lidocaine   Timeout Verified patient identification, verified procedure, site/side was marked, verified correct patient position, special equipment/implants available, medications/allergies/relevant history reviewed, required imaging and test results available.  Sterile Technique Maximal sterile technique including full sterile barrier drape, hand hygiene, sterile gown, sterile gloves, mask, hair covering, sterile ultrasound probe cover (if used).  Procedure Description Area of catheter insertion was cleaned with chlorhexidine and draped in sterile fashion.  With real-time ultrasound guidance a central venous catheter was placed into the left internal jugular vein. Nonpulsatile blood flow and easy flushing noted in all ports.  The catheter was sutured in place and sterile dressing applied.  Complications/Tolerance None; patient tolerated the procedure well. Chest X-ray is ordered to verify placement for internal jugular or subclavian cannulation.     Coralyn Helling, MD [facility] Pulmonary/Critical Care Pager - [phone] 10/07/2020, 3:55 AM

## 2020-10-07 NOTE — Consult Note (Signed)
[facility] Renal Consultation Note  Requesting MD:  Craige Cotta Indication for Consultation: AKI and hyperkalemia   HPI:  Heather Lynn is a 19 y.o. female with no past medical history who presented to the hospital yesterday with abdominal pain-  Found to have pancreatitis.  Over the course of the day she has decompensated and developed AKI with hyperkalemia-  Of note, it appears she has received many liters of lactated ringers.  Latest K was 6.6-  crt which was less than 1 then 1.44.  She is acidotic with elevated lactate- pH 7.1.  Needed to be intubated this AM.  Regarding potassium, earlier in day did get short acting treatments... then now for K of 6.6 received kaexylate rectally.  Question of CRRT has been raised and that is the reason for consult.  Latest K's have been over 7 with crt of 3- no UOP with foley in place  Creatinine, Ser  Date/Time Value Ref Range Status  10/07/2020 02:20 AM 3.02 (H) 0.44 - 1.00 mg/dL Final  84/13/2440 10:27 AM 3.09 (H) 0.44 - 1.00 mg/dL Final    Comment:    DELTA CHECK NOTED  10/06/2020 02:27 PM 1.44 (H) 0.44 - 1.00 mg/dL Final  25/36/6440 34:74 AM 0.78 0.44 - 1.00 mg/dL Final  25/95/6387 56:43 PM 0.75 0.44 - 1.00 mg/dL Final     PMHx:   Past Medical History:  Diagnosis Date  . Anxiety     Past Surgical History:  Procedure Laterality Date  . WISDOM TOOTH EXTRACTION      Family Hx:  Family History  Problem Relation Age of Onset  . Alcohol abuse Mother   . Depression Mother   . Depression Half-Brother   . Alcohol abuse Half-Brother   . Post-traumatic stress disorder Half-Sister     Social History:  reports that she has quit smoking. She has never used smokeless tobacco. She reports previous alcohol use. She reports previous drug use.  Allergies:  Allergies  Allergen Reactions  . Lamictal [Lamotrigine]     Severe anger    Medications: Prior to Admission medications   Medication Sig Start Date End Date Taking? Authorizing  Provider  clonazePAM (KLONOPIN) 1 MG tablet Take 1 tablet (1 mg total) by mouth daily as needed for anxiety. 10/09/20 11/08/20 Yes Corie Chiquito, PMHNP  FLUoxetine (PROZAC) 20 MG capsule Take 1 capsule (20 mg total) by mouth daily. 09/28/20  Yes Corie Chiquito, PMHNP  QUEtiapine (SEROQUEL) 400 MG tablet Take 1 tablet (400 mg total) by mouth at bedtime. 09/28/20 10/28/20 Yes Corie Chiquito, PMHNP    I have reviewed the patient's current medications.  Labs:  Results for orders placed or performed during the hospital encounter of 09/29/2020 (from the past 48 hour(s))  Basic metabolic panel     Status: Abnormal   Collection Time: 09/30/2020 10:03 PM  Result Value Ref Range   Sodium 138 135 - 145 mmol/L   Potassium 3.5 3.5 - 5.1 mmol/L   Chloride 103 98 - 111 mmol/L   CO2 22 22 - 32 mmol/L   Glucose, Bld 143 (H) 70 - 99 mg/dL    Comment: Glucose reference range applies only to samples taken after fasting for at least 8 hours.   BUN 13 6 - 20 mg/dL   Creatinine, Ser 3.29 0.44 - 1.00 mg/dL   Calcium 9.2 8.9 - 51.8 mg/dL   GFR, Estimated >84 >16 mL/min   Anion gap 13 5 - 15    Comment: Performed at [facility], [address], [place], [place] [address]  CBC     Status: Abnormal   Collection Time: 2020-10-14 10:03 PM  Result Value Ref Range   WBC 21.8 (H) 4.0 - 10.5 K/uL   RBC 5.01 3.87 - 5.11 MIL/uL   Hemoglobin 12.9 12.0 - 15.0 g/dL   HCT 40.1 36 - 46 %   MCV 82.2 80.0 - 100.0 fL   MCH 25.7 (L) 26.0 - 34.0 pg   MCHC 31.3 30.0 - 36.0 g/dL   RDW 02.7 (H) 25.3 - 66.4 %   Platelets 457 (H) 150 - 400 K/uL   nRBC 0.0 0.0 - 0.2 %    Comment: Performed at [facility], [address], [place], [place] [address]  Troponin I (High Sensitivity)     Status: None   Collection Time: 10/14/20 10:03 PM  Result Value Ref Range   Troponin I (High Sensitivity) <2 <18 ng/L    Comment: (NOTE) Elevated high sensitivity troponin I (hsTnI) values and significant   changes across serial measurements may suggest ACS but many other  chronic and acute conditions are known to elevate hsTnI results.  Refer to the "Links" section for chest pain algorithms and additional  guidance. Performed at [facility], [address], [place], [place] [address]   Differential     Status: Abnormal   Collection Time: 10/14/20 10:03 PM  Result Value Ref Range   Neutrophils Relative % 63 %   Neutro Abs 13.7 (H) 1.7 - 7.7 K/uL   Lymphocytes Relative 27 %   Lymphs Abs 6.0 (H) 0.7 - 4.0 K/uL   Monocytes Relative 6 %   Monocytes Absolute 1.3 (H) 0.1 - 1.0 K/uL   Eosinophils Relative 2 %   Eosinophils Absolute 0.4 0.0 - 0.5 K/uL   Basophils Relative 0 %   Basophils Absolute 0.1 0.0 - 0.1 K/uL   Immature Granulocytes 2 %   Abs Immature Granulocytes 0.37 (H) 0.00 - 0.07 K/uL    Comment: Performed at [facility], [address], [place], [place] [address]  I-Stat beta hCG blood, ED     Status: None   Collection Time: October 14, 2020 10:11 PM  Result Value Ref Range   I-stat hCG, quantitative <5.0 <5 mIU/mL   Comment 3            Comment:   GEST. AGE      CONC.  (mIU/mL)   <=1 WEEK        5 - 50     2 WEEKS       50 - 500     3 WEEKS       100 - 10,000     4 WEEKS     1,000 - 30,000        FEMALE AND NON-PREGNANT FEMALE:     LESS THAN 5 mIU/mL   Hepatic function panel     Status: Abnormal   Collection Time: 10/06/20 12:22 AM  Result Value Ref Range   Total Protein 7.6 6.5 - 8.1 g/dL   Albumin 3.3 (L) 3.5 - 5.0 g/dL   AST 46 (H) 15 - 41 U/L   ALT 55 (H) 0 - 44 U/L   Alkaline Phosphatase 79 38 - 126 U/L   Total Bilirubin 0.8 0.3 - 1.2 mg/dL   Bilirubin, Direct 0.1 0.0 - 0.2 mg/dL   Indirect Bilirubin 0.7 0.3 - 0.9 mg/dL    Comment: Performed at [facility],  [address], [place], [place] [address]  Lipase, blood     Status: Abnormal   Collection Time: 10/06/20 12:22 AM  Result Value Ref Range   Lipase  1,346 (H) 11 - 51 U/L    Comment: RESULTS CONFIRMED BY MANUAL DILUTION Performed at [facility], [address], [place], [place] [address]   Troponin I (High Sensitivity)     Status: None   Collection Time: 10/06/20 12:39 AM  Result Value Ref Range   Troponin I (High Sensitivity) <2 <18 ng/L    Comment: (NOTE) Elevated high sensitivity troponin I (hsTnI) values and significant  changes across serial measurements may suggest ACS but many other  chronic and acute conditions are known to elevate hsTnI results.  Refer to the "Links" section for chest pain algorithms and additional  guidance. Performed at [facility], [address], [place], [place] [address]   Urinalysis, Routine w reflex microscopic Urine, Clean Catch     Status: Abnormal   Collection Time: 10/06/20  2:00 AM  Result Value Ref Range   Color, Urine STRAW (A) YELLOW   APPearance CLEAR CLEAR   Specific Gravity, Urine 1.034 (H) 1.005 - 1.030   pH 5.0 5.0 - 8.0   Glucose, UA NEGATIVE NEGATIVE mg/dL   Hgb urine dipstick MODERATE (A) NEGATIVE   Bilirubin Urine NEGATIVE NEGATIVE   Ketones, ur 5 (A) NEGATIVE mg/dL   Protein, ur NEGATIVE NEGATIVE mg/dL   Nitrite NEGATIVE NEGATIVE   Leukocytes,Ua NEGATIVE NEGATIVE   RBC / HPF 11-20 0 - 5 RBC/hpf   WBC, UA 0-5 0 - 5 WBC/hpf   Bacteria, UA NONE SEEN NONE SEEN   Squamous Epithelial / LPF 0-5 0 - 5    Comment: Performed at [facility], [address] [place], [place] [address]  Triglycerides     Status: Abnormal   Collection Time: 10/06/20  3:04 AM  Result Value Ref Range   Triglycerides 181 (H) <150 mg/dL    Comment: Performed at [facility], [address] [place], [place] [address]  Respiratory Panel by RT PCR (Flu A&B, Covid) - Nasopharyngeal Swab     Status: None   Collection Time: 10/06/20  3:04 AM   Specimen: Nasopharyngeal Swab  Result Value Ref Range   SARS Coronavirus 2 by RT PCR  NEGATIVE NEGATIVE    Comment: (NOTE) SARS-CoV-2 target nucleic acids are NOT DETECTED.  The SARS-CoV-2 RNA is generally detectable in upper respiratoy specimens during the acute phase of infection. The lowest concentration of SARS-CoV-2 viral copies this assay can detect is 131 copies/mL. A negative result does not preclude SARS-Cov-2 infection and should not be used as the sole basis for treatment or other patient management decisions. A negative result may occur with  improper specimen collection/handling, submission of specimen other than nasopharyngeal swab, presence of viral mutation(s) within the areas targeted by this assay, and inadequate number of viral copies (<131 copies/mL). A negative result must be combined with clinical observations, patient history, and epidemiological information. The expected result is Negative.  Fact Sheet for Patients:  https://www.moore.com/  Fact Sheet for Healthcare Providers:  https://www.young.biz/  This test is no t yet approved or cleared by the [place] FDA and  has been authorized for detection and/or diagnosis of SARS-CoV-2 by FDA under an Emergency Use Authorization (EUA). This EUA will remain  in effect (meaning this test can be used) for the duration of the COVID-19 declaration under Section 564(b)(1)  of the Act, 21 U.S.C. section 360bbb-3(b)(1), unless the authorization is terminated or revoked sooner.     Influenza A by PCR NEGATIVE NEGATIVE   Influenza B by PCR NEGATIVE NEGATIVE    Comment: (NOTE) The Xpert Xpress SARS-CoV-2/FLU/RSV assay is intended as an aid in  the diagnosis of influenza from Nasopharyngeal swab specimens and  should not be used as a sole basis for treatment. Nasal washings and  aspirates are unacceptable for Xpert Xpress SARS-CoV-2/FLU/RSV  testing.  Fact Sheet for Patients: https://www.moore.com/  Fact Sheet for Healthcare  Providers: https://www.young.biz/  This test is not yet approved or cleared by the [place] FDA and  has been authorized for detection and/or diagnosis of SARS-CoV-2 by  FDA under an Emergency Use Authorization (EUA). This EUA will remain  in effect (meaning this test can be used) for the duration of the  Covid-19 declaration under Section 564(b)(1) of the Act, 21  U.S.C. section 360bbb-3(b)(1), unless the authorization is  terminated or revoked. Performed at [facility], [address], [place], [place] [address]   CBG monitoring, ED     Status: Abnormal   Collection Time: 10/06/20  7:32 AM  Result Value Ref Range   Glucose-Capillary 191 (H) 70 - 99 mg/dL    Comment: Glucose reference range applies only to samples taken after fasting for at least 8 hours.  HIV Antibody (routine testing w rflx)     Status: None   Collection Time: 10/06/20  7:44 AM  Result Value Ref Range   HIV Screen 4th Generation wRfx Non Reactive Non Reactive    Comment: Performed at [facility], [address] [place], [place] [address]  Creatinine, serum     Status: None   Collection Time: 10/06/20  7:44 AM  Result Value Ref Range   Creatinine, Ser 0.78 0.44 - 1.00 mg/dL   GFR, Estimated >19 >14 mL/min    Comment: (NOTE) Calculated using the CKD-EPI Creatinine Equation (2021) Performed at [facility], [address], [place], [place] [address]   Hepatic function panel     Status: Abnormal   Collection Time: 10/06/20  7:44 AM  Result Value Ref Range   Total Protein 7.1 6.5 - 8.1 g/dL   Albumin 3.0 (L) 3.5 - 5.0 g/dL   AST 47 (H) 15 - 41 U/L   ALT 48 (H) 0 - 44 U/L   Alkaline Phosphatase 73 38 - 126 U/L   Total Bilirubin 0.4 0.3 - 1.2 mg/dL   Bilirubin, Direct 0.1 0.0 - 0.2 mg/dL   Indirect Bilirubin 0.3 0.3 - 0.9 mg/dL    Comment: Performed at [facility], [address] [place], [place] [address]  CBC WITH  DIFFERENTIAL     Status: Abnormal   Collection Time: 10/06/20  7:44 AM  Result Value Ref Range   WBC 40.3 (H) 4.0 - 10.5 K/uL   RBC 5.61 (H) 3.87 - 5.11 MIL/uL   Hemoglobin 14.3 12.0 - 15.0 g/dL   HCT 86.5 (H) 36 - 46 %   MCV 84.5 80.0 - 100.0 fL   MCH 25.5 (L) 26.0 - 34.0 pg   MCHC 30.2 30.0 - 36.0 g/dL   RDW 78.4 (H) 69.6 - 29.5 %   Platelets 525 (H) 150 - 400 K/uL   nRBC 0.0 0.0 - 0.2 %   Neutrophils Relative % 86 %   Neutro Abs 34.7 (H) 1.7 - 7.7 K/uL   Lymphocytes Relative 6 %  Lymphs Abs 2.4 0.7 - 4.0 K/uL   Monocytes Relative 6 %   Monocytes Absolute 2.4 (H) 0.1 - 1.0 K/uL   Eosinophils Relative 0 %   Eosinophils Absolute 0.0 0.0 - 0.5 K/uL   Basophils Relative 0 %   Basophils Absolute 0.1 0.0 - 0.1 K/uL   WBC Morphology VACUOLATED NEUTROPHILS    Immature Granulocytes 2 %   Abs Immature Granulocytes 0.65 (H) 0.00 - 0.07 K/uL    Comment: Performed at [facility], [address] [place], [place] [id number]  Urine rapid drug screen (hosp performed)     Status: Abnormal   Collection Time: 10/06/20 11:54 AM  Result Value Ref Range   Opiates POSITIVE (A) NONE DETECTED   Cocaine NONE DETECTED NONE DETECTED   Benzodiazepines NONE DETECTED NONE DETECTED   Amphetamines NONE DETECTED NONE DETECTED   Tetrahydrocannabinol POSITIVE (A) NONE DETECTED   Barbiturates NONE DETECTED NONE DETECTED    Comment: (NOTE) DRUG SCREEN FOR MEDICAL PURPOSES ONLY.  IF CONFIRMATION IS NEEDED FOR ANY PURPOSE, NOTIFY LAB WITHIN 5 DAYS.  LOWEST DETECTABLE LIMITS FOR URINE DRUG SCREEN Drug Class                     Cutoff (ng/mL) Amphetamine and metabolites    1000 Barbiturate and metabolites    200 Benzodiazepine                 200 Tricyclics and metabolites     300 Opiates and metabolites        300 Cocaine and metabolites        300 THC                            50 Performed at [facility], [address] [place], [place] [address]   CBG  monitoring, ED     Status: Abnormal   Collection Time: 10/06/20 11:56 AM  Result Value Ref Range   Glucose-Capillary 170 (H) 70 - 99 mg/dL    Comment: Glucose reference range applies only to samples taken after fasting for at least 8 hours.  Comprehensive metabolic panel     Status: Abnormal   Collection Time: 10/06/20  2:27 PM  Result Value Ref Range   Sodium 139 135 - 145 mmol/L   Potassium 6.1 (H) 3.5 - 5.1 mmol/L    Comment: DELTA CHECK NOTED REPEATED TO VERIFY NO VISIBLE HEMOLYSIS    Chloride 103 98 - 111 mmol/L   CO2 14 (L) 22 - 32 mmol/L   Glucose, Bld 209 (H) 70 - 99 mg/dL    Comment: Glucose reference range applies only to samples taken after fasting for at least 8 hours.   BUN 14 6 - 20 mg/dL   Creatinine, Ser 0.981.44 (H) 0.44 - 1.00 mg/dL   Calcium 7.6 (L) 8.9 - 10.3 mg/dL   Total Protein 6.2 (L) 6.5 - 8.1 g/dL   Albumin 2.7 (L) 3.5 - 5.0 g/dL   AST 60 (H) 15 - 41 U/L   ALT 41 0 - 44 U/L   Alkaline Phosphatase 61 38 - 126 U/L   Total Bilirubin 0.8 0.3 - 1.2 mg/dL   GFR, Estimated 53 (L) >60 mL/min    Comment: (NOTE) Calculated using the CKD-EPI Creatinine Equation (2021)    Anion gap 22 (H) 5 - 15    Comment: REPEATED TO VERIFY Performed at [facility], [address] [place], [place] [address]   CBC     Status: Abnormal   Collection Time: 10/06/20  2:27 PM  Result Value Ref Range   WBC 49.1 (H) 4.0 - 10.5 K/uL   RBC 5.67 (H) 3.87 - 5.11 MIL/uL   Hemoglobin 14.6 12.0 - 15.0 g/dL   HCT 27.7 (H) 36 - 46 %   MCV 88.7 80.0 - 100.0 fL   MCH 25.7 (L) 26.0 - 34.0 pg   MCHC 29.0 (L) 30.0 - 36.0 g/dL   RDW 82.4 (H) 23.5 - 36.1 %   Platelets 512 (H) 150 - 400 K/uL   nRBC 0.1 0.0 - 0.2 %    Comment: Performed at [facility], [address], [place], [place] [address]  Lipase, blood     Status: Abnormal   Collection Time: 10/06/20  2:27 PM  Result Value Ref Range   Lipase 5,525 (H) 11 - 51 U/L    Comment: RESULTS CONFIRMED BY  MANUAL DILUTION Performed at [facility], [address] [place], [place] [address]   Lactic acid, plasma     Status: Abnormal   Collection Time: 10/06/20  6:14 PM  Result Value Ref Range   Lactic Acid, Venous 10.9 (HH) 0.5 - 1.9 mmol/L    Comment: CRITICAL RESULT CALLED TO, READ BACK BY AND VERIFIED WITH: I.HODGES AT 2014 ON 10/06/20 BY N.THOMPSON Performed at [facility], [address], [place], [place] [address]   Procalcitonin - Baseline     Status: None   Collection Time: 10/06/20  6:14 PM  Result Value Ref Range   Procalcitonin 3.89 ng/mL    Comment:        Interpretation: PCT > 2 ng/mL: Systemic infection (sepsis) is likely, unless other causes are known. (NOTE)       Sepsis PCT Algorithm           Lower Respiratory Tract                                      Infection PCT Algorithm    ----------------------------     ----------------------------         PCT < 0.25 ng/mL                PCT < 0.10 ng/mL          Strongly encourage             Strongly discourage   discontinuation of antibiotics    initiation of antibiotics    ----------------------------     -----------------------------       PCT 0.25 - 0.50 ng/mL            PCT 0.10 - 0.25 ng/mL               OR       >80% decrease in PCT            Discourage initiation of                                            antibiotics      Encourage discontinuation           of antibiotics    ----------------------------     -----------------------------  PCT >= 0.50 ng/mL              PCT 0.26 - 0.50 ng/mL               AND       <80% decrease in PCT              Encourage initiation of                                             antibiotics       Encourage continuation           of antibiotics    ----------------------------     -----------------------------        PCT >= 0.50 ng/mL                  PCT > 0.50 ng/mL               AND         increase in PCT                   Strongly encourage                                      initiation of antibiotics    Strongly encourage escalation           of antibiotics                                     -----------------------------                                           PCT <= 0.25 ng/mL                                                 OR                                        > 80% decrease in PCT                                      Discontinue / Do not initiate                                             antibiotics  Performed at [facility], [address], [place], [place] [address]   CBG monitoring, ED     Status: Abnormal   Collection Time: 10/06/20  6:30 PM  Result Value Ref Range   Glucose-Capillary 226 (H) 70 - 99 mg/dL    Comment: Glucose reference range applies only to samples taken after fasting for at least  8 hours.  CBG monitoring, ED     Status: Abnormal   Collection Time: 10/06/20  7:53 PM  Result Value Ref Range   Glucose-Capillary 295 (H) 70 - 99 mg/dL    Comment: Glucose reference range applies only to samples taken after fasting for at least 8 hours.  Blood gas, arterial     Status: Abnormal   Collection Time: 10/06/20  8:44 PM  Result Value Ref Range   FIO2 ROOM AIR    pH, Arterial 7.091 (LL) 7.35 - 7.45    Comment: CRITICAL RESULT CALLED TO, READ BACK BY AND VERIFIED WITH: HODGES,I. RN @2054  ON 10.21.2021 BY COHEN,K    pCO2 arterial 39.0 32 - 48 mmHg   pO2, Arterial 124 (H) 83 - 108 mmHg   Bicarbonate 11.3 (L) 20.0 - 28.0 mmol/L   Acid-base deficit 17.6 (H) 0.0 - 2.0 mmol/L   O2 Saturation 97.2 %   Patient temperature 98.6    Collection site RIGHT RADIAL    Drawn by [id number]    Sample type ARTERIAL    Allens test (pass/fail) PASS PASS    Comment: Performed at [facility], [address] [place], [place] [address]  Lactic acid, plasma     Status: Abnormal   Collection Time: 10/06/20  9:08 PM  Result Value Ref Range   Lactic Acid,  Venous 9.2 (HH) 0.5 - 1.9 mmol/L    Comment: CRITICAL VALUE NOTED.  VALUE IS CONSISTENT WITH PREVIOUSLY REPORTED AND CALLED VALUE. Performed at [facility], [address], [place], [place] [address]   Cortisol     Status: None   Collection Time: 10/06/20 10:45 PM  Result Value Ref Range   Cortisol, Plasma 68.4 ug/dL    Comment: RESULTS CONFIRMED BY MANUAL DILUTION (NOTE) AM    6.7 - 22.6 ug/dL PM   <14.7       ug/dL Performed at [facility], [address] [place], [place] [address]   MRSA PCR Screening     Status: None   Collection Time: 10/06/20 10:47 PM   Specimen: Nasal Mucosa; Nasopharyngeal  Result Value Ref Range   MRSA by PCR NEGATIVE NEGATIVE    Comment:        The GeneXpert MRSA Assay (FDA approved for NASAL specimens only), is one component of a comprehensive MRSA colonization surveillance program. It is not intended to diagnose MRSA infection nor to guide or monitor treatment for MRSA infections. Performed at [facility], [address] [place], [place] [address]   Glucose, capillary     Status: Abnormal   Collection Time: 10/06/20 11:26 PM  Result Value Ref Range   Glucose-Capillary 172 (H) 70 - 99 mg/dL    Comment: Glucose reference range applies only to samples taken after fasting for at least 8 hours.  I-STAT 7, (LYTES, BLD GAS, ICA, H+H)     Status: Abnormal   Collection Time: 10/07/20  1:48 AM  Result Value Ref Range   pH, Arterial 7.185 (LL) 7.35 - 7.45   pCO2 arterial 34.7 32 - 48 mmHg   pO2, Arterial 72 (L) 83 - 108 mmHg   Bicarbonate 12.9 (L) 20.0 - 28.0 mmol/L   TCO2 14 (L) 22 - 32 mmol/L   O2 Saturation 89.0 %   Acid-base deficit 14.0 (H) 0.0 - 2.0 mmol/L   Sodium 138 135 - 145 mmol/L   Potassium 6.6 (HH) 3.5 - 5.1 mmol/L   Calcium, Ion 0.78 (LL) 1.15 -  1.40 mmol/L   HCT 30.0 (L) 36 - 46 %   Hemoglobin 10.2 (L) 12.0 - 15.0 g/dL   Patient temperature 161.0 F    Sample type ARTERIAL     Comment VALUES EXPECTED, NO REPEAT   Procalcitonin     Status: None   Collection Time: 10/07/20  2:20 AM  Result Value Ref Range   Procalcitonin 7.53 ng/mL    Comment:        Interpretation: PCT > 2 ng/mL: Systemic infection (sepsis) is likely, unless other causes are known. (NOTE)       Sepsis PCT Algorithm           Lower Respiratory Tract                                      Infection PCT Algorithm    ----------------------------     ----------------------------         PCT < 0.25 ng/mL                PCT < 0.10 ng/mL          Strongly encourage             Strongly discourage   discontinuation of antibiotics    initiation of antibiotics    ----------------------------     -----------------------------       PCT 0.25 - 0.50 ng/mL            PCT 0.10 - 0.25 ng/mL               OR       >80% decrease in PCT            Discourage initiation of                                            antibiotics      Encourage discontinuation           of antibiotics    ----------------------------     -----------------------------         PCT >= 0.50 ng/mL              PCT 0.26 - 0.50 ng/mL               AND       <80% decrease in PCT              Encourage initiation of                                             antibiotics       Encourage continuation           of antibiotics    ----------------------------     -----------------------------        PCT >= 0.50 ng/mL                  PCT > 0.50 ng/mL               AND         increase in PCT  Strongly encourage                                      initiation of antibiotics    Strongly encourage escalation           of antibiotics                                     -----------------------------                                           PCT <= 0.25 ng/mL                                                 OR                                        > 80% decrease in PCT                                      Discontinue / Do not  initiate                                             antibiotics  Performed at [facility], [address], [place], [place] [address]   CBC     Status: Abnormal   Collection Time: 10/07/20  2:20 AM  Result Value Ref Range   WBC 40.5 (H) 4.0 - 10.5 K/uL   RBC 3.95 3.87 - 5.11 MIL/uL   Hemoglobin 10.1 (L) 12.0 - 15.0 g/dL    Comment: REPEATED TO VERIFY   HCT 34.7 (L) 36 - 46 %   MCV 87.8 80.0 - 100.0 fL   MCH 25.6 (L) 26.0 - 34.0 pg   MCHC 29.1 (L) 30.0 - 36.0 g/dL   RDW 47.8 (H) 29.5 - 62.1 %   Platelets 296 150 - 400 K/uL   nRBC 0.3 (H) 0.0 - 0.2 %    Comment: Performed at [facility], [address] [place], [place] [address]  Magnesium     Status: None   Collection Time: 10/07/20  2:20 AM  Result Value Ref Range   Magnesium 1.7 1.7 - 2.4 mg/dL    Comment: Performed at [facility], [address] [place], [place] [address]  Phosphorus     Status: Abnormal   Collection Time: 10/07/20  2:20 AM  Result Value Ref Range   Phosphorus 4.8 (H) 2.5 - 4.6 mg/dL    Comment: Performed at [facility], [address], [place], [place] [address]  Comprehensive metabolic panel     Status: Abnormal   Collection Time: 10/07/20  2:20 AM  Result Value Ref Range   Sodium 138 135 - 145 mmol/L   Potassium 7.2 (HH)  3.5 - 5.1 mmol/L    Comment: NO VISIBLE HEMOLYSIS CRITICAL RESULT CALLED TO, READ BACK BY AND VERIFIED WITH: JENNA BURDICK @ 0315 ON 10/07/20 C VARNER    Chloride 108 98 - 111 mmol/L   CO2 13 (L) 22 - 32 mmol/L   Glucose, Bld 182 (H) 70 - 99 mg/dL    Comment: Glucose reference range applies only to samples taken after fasting for at least 8 hours.   BUN 23 (H) 6 - 20 mg/dL   Creatinine, Ser 4.09 (H) 0.44 - 1.00 mg/dL   Calcium 6.0 (LL) 8.9 - 10.3 mg/dL    Comment: CRITICAL RESULT CALLED TO, READ BACK BY AND VERIFIED WITH: JENNA BURDICK @ 0315 ON 10/07/20 C VARNER    Total Protein 5.9 (L) 6.5 - 8.1  g/dL   Albumin 3.8 3.5 - 5.0 g/dL   AST 8,119 (H) 15 - 41 U/L   ALT 638 (H) 0 - 44 U/L   Alkaline Phosphatase 32 (L) 38 - 126 U/L   Total Bilirubin 1.6 (H) 0.3 - 1.2 mg/dL   GFR, Estimated 22 (L) >60 mL/min    Comment: (NOTE) Calculated using the CKD-EPI Creatinine Equation (2021)    Anion gap 17 (H) 5 - 15    Comment: Performed at [facility], [address], [place], [place] [address]  Triglycerides     Status: Abnormal   Collection Time: 10/07/20  2:20 AM  Result Value Ref Range   Triglycerides 272 (H) <150 mg/dL    Comment: Performed at [facility], [address] [place], [place] [address]  Basic metabolic panel     Status: Abnormal   Collection Time: 10/07/20  2:20 AM  Result Value Ref Range   Sodium 139 135 - 145 mmol/L   Potassium 7.4 (HH) 3.5 - 5.1 mmol/L    Comment: NO VISIBLE HEMOLYSIS CRITICAL RESULT CALLED TO, READ BACK BY AND VERIFIED WITH: SHAY, RN @ 0310 ON 10/07/20 C VARNER    Chloride 108 98 - 111 mmol/L   CO2 14 (L) 22 - 32 mmol/L   Glucose, Bld 184 (H) 70 - 99 mg/dL    Comment: Glucose reference range applies only to samples taken after fasting for at least 8 hours.   BUN 25 (H) 6 - 20 mg/dL   Creatinine, Ser 3.08 (H) 0.44 - 1.00 mg/dL    Comment: DELTA CHECK NOTED   Calcium 5.9 (LL) 8.9 - 10.3 mg/dL    Comment: CRITICAL RESULT CALLED TO, READ BACK BY AND VERIFIED WITH: SHAY, RN @ 0310 ON 10/07/20 C VARNER    GFR, Estimated 21 (L) >60 mL/min    Comment: (NOTE) Calculated using the CKD-EPI Creatinine Equation (2021)    Anion gap 17 (H) 5 - 15    Comment: Performed at [facility], [address] [place], [place] [address]  Glucose, capillary     Status: Abnormal   Collection Time: 10/07/20  2:33 AM  Result Value Ref Range   Glucose-Capillary 260 (H) 70 - 99 mg/dL    Comment: Glucose reference range applies only to samples taken after fasting for at least 8 hours.  Glucose, capillary      Status: Abnormal   Collection Time: 10/07/20  4:01 AM  Result Value Ref Range   Glucose-Capillary 224 (H) 70 - 99 mg/dL    Comment: Glucose reference range applies only to samples taken after fasting for at least 8 hours.     ROS:  Review of systems not obtained due to patient factors.  Physical Exam: Vitals:   10/07/20 0115 10/07/20 0130  BP: 131/62 139/88  Pulse: (!) 139 (!) 138  Resp: (!) 51 (!) 54  Temp: (!) 101.3 F (38.5 C) (!) 101.1 F (38.4 C)  SpO2: 98% 98%     General: sedated, intubated, pale- obese HEENT: PERRLA, EOMI Neck: no JVD Heart: tachy Lungs: CBX bilat Abdomen: distended Extremities: 1+ edema Skin: warm and dry Neuro: sedated   Assessment/Plan: 19 year old WF with no past medical history and normal renal function now with severe pancreatitis of unknown etiology with complications of respiratory failure/SIRS as well as AKI with hyperkalemia and acidosis 1.Renal- AKI in the setting of severe pancreatitis-  No UOP-  Clinical deterioration in the last 12 hours.   Indications for RRT are acidosis and hyperkalemia-  initially when K was in the 6's I wanted to try medical management but now with it in the 7's with worsening clinical status the best course of action is to proceed with CRRT- no heparin 2.  Hyperkalemia-  Severe-  In the setting of AKI, acidosis as well as LR admin.  Seems to have failed medical management will go ahead and initiate CRRT to treat more effectively  3. Hypertension/volume  - 4 liters positive, in the midst of volume resuscitation-  Is edematous-  To start will not remove any fluid with CRRT 4. Acidosis-  Mostly metabolic with elevated lactate-  Bicarb pre and post with CRRT 5. Anemia  - supportive care  6. Pancreatitis-  Of unknown etiology, severe-  Supportive care  CCM had said they attempted to call husband upon intubation- no answer-  I did not attempt to call him at this hour   Cecille Aver 10/07/2020, 2:59 AM

## 2020-10-08 ENCOUNTER — Inpatient Hospital Stay (HOSPITAL_COMMUNITY): Payer: BC Managed Care – PPO

## 2020-10-08 ENCOUNTER — Inpatient Hospital Stay (HOSPITAL_COMMUNITY): Payer: BC Managed Care – PPO | Admitting: Anesthesiology

## 2020-10-08 ENCOUNTER — Encounter (HOSPITAL_COMMUNITY): Admission: EM | Disposition: E | Payer: Self-pay | Source: Home / Self Care | Attending: Pulmonary Disease

## 2020-10-08 DIAGNOSIS — A419 Sepsis, unspecified organism: Principal | ICD-10-CM

## 2020-10-08 DIAGNOSIS — K8500 Idiopathic acute pancreatitis without necrosis or infection: Secondary | ICD-10-CM | POA: Diagnosis not present

## 2020-10-08 DIAGNOSIS — R6521 Severe sepsis with septic shock: Secondary | ICD-10-CM | POA: Diagnosis not present

## 2020-10-08 DIAGNOSIS — N179 Acute kidney failure, unspecified: Secondary | ICD-10-CM | POA: Diagnosis not present

## 2020-10-08 DIAGNOSIS — J9600 Acute respiratory failure, unspecified whether with hypoxia or hypercapnia: Secondary | ICD-10-CM

## 2020-10-08 HISTORY — PX: LAPAROTOMY: SHX154

## 2020-10-08 LAB — POCT I-STAT 7, (LYTES, BLD GAS, ICA,H+H)
Acid-Base Excess: 5 mmol/L — ABNORMAL HIGH (ref 0.0–2.0)
Acid-Base Excess: 1 mmol/L (ref 0.0–2.0)
Acid-base deficit: 9 mmol/L — ABNORMAL HIGH (ref 0.0–2.0)
Acid-base deficit: 9 mmol/L — ABNORMAL HIGH (ref 0.0–2.0)
Bicarbonate: 30.7 mmol/L — ABNORMAL HIGH (ref 20.0–28.0)
Bicarbonate: 28.3 mmol/L — ABNORMAL HIGH (ref 20.0–28.0)
Bicarbonate: 18.1 mmol/L — ABNORMAL LOW (ref 20.0–28.0)
Bicarbonate: 19 mmol/L — ABNORMAL LOW (ref 20.0–28.0)
Calcium, Ion: 0.51 mmol/L — CL (ref 1.15–1.40)
Calcium, Ion: 0.54 mmol/L — CL (ref 1.15–1.40)
Calcium, Ion: 0.65 mmol/L — CL (ref 1.15–1.40)
Calcium, Ion: 0.73 mmol/L — CL (ref 1.15–1.40)
HCT: 23 % — ABNORMAL LOW (ref 36.0–46.0)
HCT: 20 % — ABNORMAL LOW (ref 36.0–46.0)
HCT: 29 % — ABNORMAL LOW (ref 36.0–46.0)
HCT: 33 % — ABNORMAL LOW (ref 36.0–46.0)
Hemoglobin: 7.8 g/dL — ABNORMAL LOW (ref 12.0–15.0)
Hemoglobin: 6.8 g/dL — CL (ref 12.0–15.0)
Hemoglobin: 9.9 g/dL — ABNORMAL LOW (ref 12.0–15.0)
Hemoglobin: 11.2 g/dL — ABNORMAL LOW (ref 12.0–15.0)
O2 Saturation: 89 %
O2 Saturation: 90 %
O2 Saturation: 75 %
O2 Saturation: 74 %
Patient temperature: 34.8
Patient temperature: 35.1
Potassium: 3.7 mmol/L (ref 3.5–5.1)
Potassium: 3.2 mmol/L — ABNORMAL LOW (ref 3.5–5.1)
Potassium: 3.7 mmol/L (ref 3.5–5.1)
Potassium: 4.4 mmol/L (ref 3.5–5.1)
Sodium: 138 mmol/L (ref 135–145)
Sodium: 138 mmol/L (ref 135–145)
Sodium: 135 mmol/L (ref 135–145)
Sodium: 132 mmol/L — ABNORMAL LOW (ref 135–145)
TCO2: 32 mmol/L (ref 22–32)
TCO2: 30 mmol/L (ref 22–32)
TCO2: 20 mmol/L — ABNORMAL LOW (ref 22–32)
TCO2: 20 mmol/L — ABNORMAL LOW (ref 22–32)
pCO2 arterial: 54.9 mmHg — ABNORMAL HIGH (ref 32.0–48.0)
pCO2 arterial: 53.2 mmHg — ABNORMAL HIGH (ref 32.0–48.0)
pCO2 arterial: 42.5 mmHg (ref 32.0–48.0)
pCO2 arterial: 47.6 mmHg (ref 32.0–48.0)
pH, Arterial: 7.356 (ref 7.350–7.450)
pH, Arterial: 7.322 — ABNORMAL LOW (ref 7.350–7.450)
pH, Arterial: 7.228 — ABNORMAL LOW (ref 7.350–7.450)
pH, Arterial: 7.21 — ABNORMAL LOW (ref 7.350–7.450)
pO2, Arterial: 61 mmHg — ABNORMAL LOW (ref 83.0–108.0)
pO2, Arterial: 58 mmHg — ABNORMAL LOW (ref 83.0–108.0)
pO2, Arterial: 42 mmHg — ABNORMAL LOW (ref 83.0–108.0)
pO2, Arterial: 48 mmHg — ABNORMAL LOW (ref 83.0–108.0)

## 2020-10-08 LAB — CBC WITH DIFFERENTIAL/PLATELET
Abs Immature Granulocytes: 0.97 10*3/uL — ABNORMAL HIGH (ref 0.00–0.07)
Abs Immature Granulocytes: 2.36 10*3/uL — ABNORMAL HIGH (ref 0.00–0.07)
Basophils Absolute: 0.1 10*3/uL (ref 0.0–0.1)
Basophils Absolute: 0.1 10*3/uL (ref 0.0–0.1)
Basophils Relative: 0 %
Basophils Relative: 0 %
Eosinophils Absolute: 0 10*3/uL (ref 0.0–0.5)
Eosinophils Absolute: 0.2 10*3/uL (ref 0.0–0.5)
Eosinophils Relative: 0 %
Eosinophils Relative: 1 %
HCT: 24.9 % — ABNORMAL LOW (ref 36.0–46.0)
HCT: 28.6 % — ABNORMAL LOW (ref 36.0–46.0)
Hemoglobin: 7.7 g/dL — ABNORMAL LOW (ref 12.0–15.0)
Hemoglobin: 8.9 g/dL — ABNORMAL LOW (ref 12.0–15.0)
Immature Granulocytes: 4 %
Immature Granulocytes: 7 %
Lymphocytes Relative: 13 %
Lymphocytes Relative: 14 %
Lymphs Abs: 3.6 10*3/uL (ref 0.7–4.0)
Lymphs Abs: 4.8 10*3/uL — ABNORMAL HIGH (ref 0.7–4.0)
MCH: 26.5 pg (ref 26.0–34.0)
MCH: 27.7 pg (ref 26.0–34.0)
MCHC: 30.9 g/dL (ref 30.0–36.0)
MCHC: 31.1 g/dL (ref 30.0–36.0)
MCV: 85.6 fL (ref 80.0–100.0)
MCV: 89.1 fL (ref 80.0–100.0)
Monocytes Absolute: 2 10*3/uL — ABNORMAL HIGH (ref 0.1–1.0)
Monocytes Absolute: 2.1 10*3/uL — ABNORMAL HIGH (ref 0.1–1.0)
Monocytes Relative: 7 %
Monocytes Relative: 6 %
Neutro Abs: 20.5 10*3/uL — ABNORMAL HIGH (ref 1.7–7.7)
Neutro Abs: 25.5 10*3/uL — ABNORMAL HIGH (ref 1.7–7.7)
Neutrophils Relative %: 76 %
Neutrophils Relative %: 72 %
Platelets: 146 10*3/uL — ABNORMAL LOW (ref 150–400)
Platelets: 107 10*3/uL — ABNORMAL LOW (ref 150–400)
RBC: 2.91 MIL/uL — ABNORMAL LOW (ref 3.87–5.11)
RBC: 3.21 MIL/uL — ABNORMAL LOW (ref 3.87–5.11)
RDW: 17.4 % — ABNORMAL HIGH (ref 11.5–15.5)
RDW: 16.9 % — ABNORMAL HIGH (ref 11.5–15.5)
WBC: 27.1 10*3/uL — ABNORMAL HIGH (ref 4.0–10.5)
WBC: 35.1 10*3/uL — ABNORMAL HIGH (ref 4.0–10.5)
nRBC: 1 % — ABNORMAL HIGH (ref 0.0–0.2)
nRBC: 2 % — ABNORMAL HIGH (ref 0.0–0.2)

## 2020-10-08 LAB — RENAL FUNCTION PANEL
Albumin: 2.6 g/dL — ABNORMAL LOW (ref 3.5–5.0)
Albumin: 1.8 g/dL — ABNORMAL LOW (ref 3.5–5.0)
Anion gap: 29 — ABNORMAL HIGH (ref 5–15)
Anion gap: 31 — ABNORMAL HIGH (ref 5–15)
BUN: 13 mg/dL (ref 6–20)
BUN: 14 mg/dL (ref 6–20)
CO2: 21 mmol/L — ABNORMAL LOW (ref 22–32)
CO2: 14 mmol/L — ABNORMAL LOW (ref 22–32)
Calcium: 4.9 mg/dL — CL (ref 8.9–10.3)
Calcium: 4.8 mg/dL — CL (ref 8.9–10.3)
Chloride: 88 mmol/L — ABNORMAL LOW (ref 98–111)
Chloride: 91 mmol/L — ABNORMAL LOW (ref 98–111)
Creatinine, Ser: 1.92 mg/dL — ABNORMAL HIGH (ref 0.44–1.00)
Creatinine, Ser: 2.24 mg/dL — ABNORMAL HIGH (ref 0.44–1.00)
GFR, Estimated: 38 mL/min — ABNORMAL LOW (ref >60)
GFR, Estimated: 31 mL/min — ABNORMAL LOW (ref >60)
Glucose, Bld: 182 mg/dL — ABNORMAL HIGH (ref 70–99)
Glucose, Bld: 126 mg/dL — ABNORMAL HIGH (ref 70–99)
Phosphorus: 6.7 mg/dL — ABNORMAL HIGH (ref 2.5–4.6)
Phosphorus: 10.5 mg/dL — ABNORMAL HIGH (ref 2.5–4.6)
Potassium: 3.5 mmol/L (ref 3.5–5.1)
Potassium: 4.5 mmol/L (ref 3.5–5.1)
Sodium: 138 mmol/L (ref 135–145)
Sodium: 136 mmol/L (ref 135–145)

## 2020-10-08 LAB — CK: Total CK: 25268 U/L — ABNORMAL HIGH (ref 38–234)

## 2020-10-08 LAB — POTASSIUM
Potassium: 3.8 mmol/L (ref 3.5–5.1)
Potassium: 4.5 mmol/L (ref 3.5–5.1)
Potassium: 4.8 mmol/L (ref 3.5–5.1)

## 2020-10-08 LAB — GLUCOSE, CAPILLARY
Glucose-Capillary: 155 mg/dL — ABNORMAL HIGH (ref 70–99)
Glucose-Capillary: 129 mg/dL — ABNORMAL HIGH (ref 70–99)
Glucose-Capillary: 61 mg/dL — ABNORMAL LOW (ref 70–99)
Glucose-Capillary: 130 mg/dL — ABNORMAL HIGH (ref 70–99)
Glucose-Capillary: 121 mg/dL — ABNORMAL HIGH (ref 70–99)
Glucose-Capillary: 121 mg/dL — ABNORMAL HIGH (ref 70–99)
Glucose-Capillary: 123 mg/dL — ABNORMAL HIGH (ref 70–99)

## 2020-10-08 LAB — HEMOGLOBIN A1C
Hgb A1c MFr Bld: 5.7 % — ABNORMAL HIGH (ref 4.8–5.6)
Mean Plasma Glucose: 117 mg/dL

## 2020-10-08 LAB — COMPREHENSIVE METABOLIC PANEL
ALT: 2715 U/L — ABNORMAL HIGH (ref 0–44)
ALT: 6755 U/L — ABNORMAL HIGH (ref 0–44)
AST: 7099 U/L — ABNORMAL HIGH (ref 15–41)
AST: <10000 U/L — ABNORMAL HIGH (ref 15–41)
Albumin: 2.6 g/dL — ABNORMAL LOW (ref 3.5–5.0)
Albumin: 1.7 g/dL — ABNORMAL LOW (ref 3.5–5.0)
Alkaline Phosphatase: 68 U/L (ref 38–126)
Alkaline Phosphatase: 353 U/L — ABNORMAL HIGH (ref 38–126)
Anion gap: 30 — ABNORMAL HIGH (ref 5–15)
Anion gap: 30 — ABNORMAL HIGH (ref 5–15)
BUN: 14 mg/dL (ref 6–20)
BUN: 15 mg/dL (ref 6–20)
CO2: 22 mmol/L (ref 22–32)
CO2: 11 mmol/L — ABNORMAL LOW (ref 22–32)
Calcium: 5 mg/dL — CL (ref 8.9–10.3)
Calcium: 4.9 mg/dL — CL (ref 8.9–10.3)
Chloride: 88 mmol/L — ABNORMAL LOW (ref 98–111)
Chloride: 92 mmol/L — ABNORMAL LOW (ref 98–111)
Creatinine, Ser: 2.03 mg/dL — ABNORMAL HIGH (ref 0.44–1.00)
Creatinine, Ser: 2.41 mg/dL — ABNORMAL HIGH (ref 0.44–1.00)
GFR, Estimated: 35 mL/min — ABNORMAL LOW (ref >60)
GFR, Estimated: 29 mL/min — ABNORMAL LOW (ref >60)
Glucose, Bld: 56 mg/dL — ABNORMAL LOW (ref 70–99)
Glucose, Bld: 122 mg/dL — ABNORMAL HIGH (ref 70–99)
Potassium: 3.9 mmol/L (ref 3.5–5.1)
Potassium: 6.1 mmol/L — ABNORMAL HIGH (ref 3.5–5.1)
Sodium: 140 mmol/L (ref 135–145)
Sodium: 133 mmol/L — ABNORMAL LOW (ref 135–145)
Total Bilirubin: 2 mg/dL — ABNORMAL HIGH (ref 0.3–1.2)
Total Bilirubin: 2.5 mg/dL — ABNORMAL HIGH (ref 0.3–1.2)
Total Protein: 4.1 g/dL — ABNORMAL LOW (ref 6.5–8.1)
Total Protein: 3.4 g/dL — ABNORMAL LOW (ref 6.5–8.1)

## 2020-10-08 LAB — BLOOD GAS, ARTERIAL
Acid-base deficit: 6.1 mmol/L — ABNORMAL HIGH (ref 0.0–2.0)
Bicarbonate: 20.5 mmol/L (ref 20.0–28.0)
FIO2: 100
O2 Saturation: 87.6 %
Patient temperature: 98.6
pCO2 arterial: 51 mmHg — ABNORMAL HIGH (ref 32.0–48.0)
pH, Arterial: 7.229 — ABNORMAL LOW (ref 7.350–7.450)
pO2, Arterial: 72.7 mmHg — ABNORMAL LOW (ref 83.0–108.0)

## 2020-10-08 LAB — HEMOGLOBIN AND HEMATOCRIT, BLOOD
HCT: 28.3 % — ABNORMAL LOW (ref 36.0–46.0)
HCT: 28.2 % — ABNORMAL LOW (ref 36.0–46.0)
Hemoglobin: 9 g/dL — ABNORMAL LOW (ref 12.0–15.0)
Hemoglobin: 8.4 g/dL — ABNORMAL LOW (ref 12.0–15.0)

## 2020-10-08 LAB — BETA-2-GLYCOPROTEIN I ABS, IGG/M/A
Beta-2 Glyco I IgG: <9 GPI IgG units (ref 0–20)
Beta-2-Glycoprotein I IgA: <9 GPI IgA units (ref 0–25)
Beta-2-Glycoprotein I IgM: <9 GPI IgM units (ref 0–32)

## 2020-10-08 LAB — PROTIME-INR
INR: 7.1 (ref 0.8–1.2)
INR: 9.2 (ref 0.8–1.2)
Prothrombin Time: 59.2 seconds — ABNORMAL HIGH (ref 11.4–15.2)
Prothrombin Time: 72.5 seconds — ABNORMAL HIGH (ref 11.4–15.2)

## 2020-10-08 LAB — CORTISOL: Cortisol, Plasma: 22.1 ug/dL

## 2020-10-08 LAB — HEPATITIS A ANTIBODY, IGM: Hep A IgM: NONREACTIVE

## 2020-10-08 LAB — PROCALCITONIN: Procalcitonin: 2.35 ng/mL

## 2020-10-08 LAB — MPO/PR-3 (ANCA) ANTIBODIES
ANCA Proteinase 3: <3.5 U/mL (ref 0.0–3.5)
Myeloperoxidase Abs: <9 U/mL (ref 0.0–9.0)

## 2020-10-08 LAB — ABO/RH: ABO/RH(D): B POS

## 2020-10-08 LAB — MAGNESIUM: Magnesium: 2.1 mg/dL (ref 1.7–2.4)

## 2020-10-08 LAB — PREPARE RBC (CROSSMATCH)

## 2020-10-08 SURGERY — LAPAROTOMY, EXPLORATORY
Anesthesia: General | Site: Abdomen

## 2020-10-08 MED ORDER — CALCIUM CHLORIDE 10 % IV SOLN
INTRAVENOUS | Status: DC | PRN
  Administered 2020-10-08 (×2): 1 g via INTRAVENOUS

## 2020-10-08 MED ORDER — SODIUM CHLORIDE 0.9 % IV SOLN
1.2500 ng/kg/min | INTRAVENOUS | Status: DC
  Administered 2020-10-08: 40 ng/kg/min via INTRAVENOUS
  Administered 2020-10-08: 1.25 ng/kg/min via INTRAVENOUS
  Filled 2020-10-08 (×2): qty 1

## 2020-10-08 MED ORDER — SODIUM CHLORIDE 0.9 % IV SOLN
100.0000 mg | Freq: Two times a day (BID) | INTRAVENOUS | Status: DC
  Administered 2020-10-08: 100 mg via INTRAVENOUS
  Filled 2020-10-08 (×2): qty 100

## 2020-10-08 MED ORDER — SODIUM BICARBONATE 8.4 % IV SOLN
50.0000 meq | Freq: Once | INTRAVENOUS | Status: AC
  Administered 2020-10-08: 50 meq via INTRAVENOUS
  Filled 2020-10-08: qty 100

## 2020-10-08 MED ORDER — PHENYLEPHRINE CONCENTRATED 100MG/250ML (0.4 MG/ML) INFUSION SIMPLE
0.0000 ug/min | INTRAVENOUS | Status: DC
  Administered 2020-10-08: 200 ug/min via INTRAVENOUS
  Filled 2020-10-08 (×2): qty 250

## 2020-10-08 MED ORDER — DEXTROSE 50 % IV SOLN
INTRAVENOUS | Status: AC
  Filled 2020-10-08: qty 50

## 2020-10-08 MED ORDER — EPINEPHRINE 1 MG/10ML IJ SOSY
PREFILLED_SYRINGE | INTRAMUSCULAR | Status: AC
  Administered 2020-10-08: 0.1 mg
  Filled 2020-10-08: qty 10

## 2020-10-08 MED ORDER — KETAMINE HCL 10 MG/ML IJ SOLN
INTRAMUSCULAR | Status: AC
  Filled 2020-10-08: qty 1

## 2020-10-08 MED ORDER — SODIUM CHLORIDE 0.9 % IV SOLN
4.0000 g | INTRAVENOUS | Status: AC
  Administered 2020-10-08: 4 g via INTRAVENOUS
  Filled 2020-10-08: qty 40

## 2020-10-08 MED ORDER — KETAMINE HCL 50 MG/ML IJ SOLN
INTRAMUSCULAR | Status: DC | PRN
  Administered 2020-10-08: 30 mg via INTRAMUSCULAR

## 2020-10-08 MED ORDER — SODIUM BICARBONATE 8.4 % IV SOLN
INTRAVENOUS | Status: AC
  Administered 2020-10-08: 100 meq via INTRAVENOUS
  Filled 2020-10-08: qty 100

## 2020-10-08 MED ORDER — EPINEPHRINE 0.1 MG/10ML (10 MCG/ML) SYRINGE FOR IV PUSH (FOR BLOOD PRESSURE SUPPORT)
5.0000 ug | PREFILLED_SYRINGE | Freq: Once | INTRAVENOUS | Status: DC | PRN
  Filled 2020-10-08 (×2): qty 10

## 2020-10-08 MED ORDER — CALCIUM GLUCONATE-NACL 2-0.675 GM/100ML-% IV SOLN
2.0000 g | Freq: Once | INTRAVENOUS | Status: AC
  Administered 2020-10-08: 2000 mg via INTRAVENOUS
  Filled 2020-10-08 (×2): qty 100

## 2020-10-08 MED ORDER — FENTANYL CITRATE (PF) 100 MCG/2ML IJ SOLN
INTRAMUSCULAR | Status: AC
  Filled 2020-10-08: qty 2

## 2020-10-08 MED ORDER — DEXTROSE 10 % IV SOLN: INTRAVENOUS | Status: DC

## 2020-10-08 MED ORDER — SODIUM CHLORIDE 0.9 % IV BOLUS
1000.0000 mL | Freq: Once | INTRAVENOUS | Status: AC
  Administered 2020-10-08: 1000 mL via INTRAVENOUS

## 2020-10-08 MED ORDER — FENTANYL CITRATE (PF) 100 MCG/2ML IJ SOLN
INTRAMUSCULAR | Status: DC | PRN
Start: 2020-10-08 — End: 2020-10-08
  Administered 2020-10-08: 50 ug via INTRAVENOUS
  Administered 2020-10-08 (×2): 25 ug via INTRAVENOUS

## 2020-10-08 MED ORDER — SODIUM CHLORIDE 0.9% IV SOLUTION: Freq: Once | INTRAVENOUS | Status: DC

## 2020-10-08 MED ORDER — MIDAZOLAM HCL 2 MG/2ML IJ SOLN
INTRAMUSCULAR | Status: AC
  Filled 2020-10-08: qty 4

## 2020-10-08 MED ORDER — HYDROCORTISONE NA SUCCINATE PF 100 MG IJ SOLR
100.0000 mg | Freq: Four times a day (QID) | INTRAMUSCULAR | Status: DC
  Administered 2020-10-08 (×2): 100 mg via INTRAVENOUS
  Filled 2020-10-08 (×2): qty 2

## 2020-10-08 MED ORDER — SODIUM CHLORIDE 0.9 % IV SOLN
1.0000 g | Freq: Once | INTRAVENOUS | Status: AC
  Administered 2020-10-08: 1 g via INTRAVENOUS
  Filled 2020-10-08: qty 10

## 2020-10-08 MED ORDER — CALCIUM GLUCONATE-NACL 2-0.675 GM/100ML-% IV SOLN
2.0000 g | Freq: Once | INTRAVENOUS | Status: AC
  Administered 2020-10-08: 2000 mg via INTRAVENOUS
  Filled 2020-10-08: qty 100

## 2020-10-08 MED ORDER — DEXTROSE 5 % IV SOLN
350.0000 mg | INTRAVENOUS | Status: DC
  Administered 2020-10-08: 350 mg via INTRAVENOUS
  Filled 2020-10-08 (×2): qty 7

## 2020-10-08 MED ORDER — EPINEPHRINE HCL 5 MG/250ML IV SOLN IN NS
0.5000 ug/min | INTRAVENOUS | Status: DC
  Administered 2020-10-08: 20 ug/min via INTRAVENOUS
  Administered 2020-10-08: 0.5 ug/min via INTRAVENOUS
  Administered 2020-10-08 (×3): 20 ug/min via INTRAVENOUS
  Filled 2020-10-08 (×6): qty 250

## 2020-10-08 MED ORDER — DEXTROSE 5 % IV SOLN: 5.0000 mg/kg | INTRAVENOUS | Status: DC

## 2020-10-08 MED ORDER — PIPERACILLIN-TAZOBACTAM IN DEX 2-0.25 GM/50ML IV SOLN
2.2500 g | Freq: Three times a day (TID) | INTRAVENOUS | Status: DC
  Administered 2020-10-08: 2.25 g via INTRAVENOUS
  Filled 2020-10-08 (×2): qty 50

## 2020-10-08 MED ORDER — SODIUM CHLORIDE 0.9 % IR SOLN
Status: DC | PRN
  Administered 2020-10-08: 1000 mL

## 2020-10-08 MED ORDER — SODIUM BICARBONATE 8.4 % IV SOLN: 100.0000 meq | Freq: Once | INTRAVENOUS | Status: AC

## 2020-10-08 MED ORDER — VASOPRESSIN 20 UNIT/ML IV SOLN
INTRAVENOUS | Status: AC
  Filled 2020-10-08: qty 1

## 2020-10-08 MED ORDER — ALBUMIN HUMAN 5 % IV SOLN
12.5000 g | Freq: Once | INTRAVENOUS | Status: AC
  Administered 2020-10-08: 12.5 g via INTRAVENOUS
  Filled 2020-10-08: qty 250

## 2020-10-08 MED ORDER — EPINEPHRINE 1 MG/10ML IJ SOSY
0.1000 mg | PREFILLED_SYRINGE | Freq: Once | INTRAMUSCULAR | Status: AC
  Administered 2020-10-08: 0.1 mg via INTRAVENOUS

## 2020-10-08 MED ORDER — ALBUMIN HUMAN 5 % IV SOLN
25.0000 g | Freq: Once | INTRAVENOUS | Status: AC
  Administered 2020-10-08: 25 g via INTRAVENOUS
  Filled 2020-10-08: qty 500

## 2020-10-08 MED ORDER — SODIUM CHLORIDE 0.9 % IV SOLN
1.2500 ng/kg/min | INTRAVENOUS | Status: DC
  Filled 2020-10-08: qty 1

## 2020-10-08 MED ORDER — ROCURONIUM BROMIDE 100 MG/10ML IV SOLN
INTRAVENOUS | Status: DC | PRN
  Administered 2020-10-08: 100 mg via INTRAVENOUS

## 2020-10-08 MED ORDER — POTASSIUM CHLORIDE 10 MEQ/50ML IV SOLN
10.0000 meq | INTRAVENOUS | Status: AC
  Administered 2020-10-08 (×2): 10 meq via INTRAVENOUS
  Filled 2020-10-08 (×4): qty 50

## 2020-10-08 MED ORDER — MIDAZOLAM HCL 2 MG/2ML IJ SOLN
INTRAMUSCULAR | Status: DC | PRN
  Administered 2020-10-08: 4 mg via INTRAVENOUS

## 2020-10-08 MED FILL — Medication: Qty: 1 | Status: AC

## 2020-10-08 SURGICAL SUPPLY — 42 items
APL PRP STRL LF DISP 70% ISPRP (MISCELLANEOUS) ×1
APL SWBSTK 6 STRL LF DISP (MISCELLANEOUS)
APPLICATOR COTTON TIP 6 STRL (MISCELLANEOUS) IMPLANT
APPLICATOR COTTON TIP 6IN STRL (MISCELLANEOUS)
BLADE EXTENDED COATED 6.5IN (ELECTRODE) IMPLANT
BLADE HEX COATED 2.75 (ELECTRODE) IMPLANT
CHLORAPREP W/TINT 26 (MISCELLANEOUS) ×3 IMPLANT
COVER MAYO STAND STRL (DRAPES) IMPLANT
COVER SURGICAL LIGHT HANDLE (MISCELLANEOUS) ×3 IMPLANT
COVER WAND RF STERILE (DRAPES) IMPLANT
DRAPE LAPAROSCOPIC ABDOMINAL (DRAPES) ×3 IMPLANT
DRAPE WARM FLUID 44X44 (DRAPES) IMPLANT
ELECT REM PT RETURN 15FT ADLT (MISCELLANEOUS) ×3 IMPLANT
GAUZE SPONGE 4X4 12PLY STRL (GAUZE/BANDAGES/DRESSINGS) IMPLANT
GLOVE BIOGEL PI IND STRL 7.0 (GLOVE) IMPLANT
GLOVE BIOGEL PI INDICATOR 7.0 (GLOVE)
GLOVE SURG ORTHO 8.0 STRL STRW (GLOVE) ×3 IMPLANT
GOWN STRL REUS W/TWL LRG LVL3 (GOWN DISPOSABLE) ×3 IMPLANT
GOWN STRL REUS W/TWL XL LVL3 (GOWN DISPOSABLE) ×6 IMPLANT
HANDLE SUCTION POOLE (INSTRUMENTS) ×1 IMPLANT
KIT BASIN OR (CUSTOM PROCEDURE TRAY) ×3 IMPLANT
KIT TURNOVER KIT A (KITS) IMPLANT
NS IRRIG 1000ML POUR BTL (IV SOLUTION) ×3 IMPLANT
PACK GENERAL/GYN (CUSTOM PROCEDURE TRAY) ×3 IMPLANT
PENCIL SMOKE EVACUATOR (MISCELLANEOUS) IMPLANT
SPONGE ABD ABTHERA ADVANCE (MISCELLANEOUS) ×3 IMPLANT
SPONGE LAP 18X18 RF (DISPOSABLE) IMPLANT
STAPLER VISISTAT 35W (STAPLE) IMPLANT
SUCTION POOLE HANDLE (INSTRUMENTS) ×3
SUT NOV 1 T60/GS (SUTURE) IMPLANT
SUT SILK 2 0 (SUTURE)
SUT SILK 2 0 SH CR/8 (SUTURE) IMPLANT
SUT SILK 2-0 18XBRD TIE 12 (SUTURE) IMPLANT
SUT SILK 3 0 (SUTURE)
SUT SILK 3 0 SH CR/8 (SUTURE) IMPLANT
SUT SILK 3-0 18XBRD TIE 12 (SUTURE) IMPLANT
SUT VICRYL 2 0 18  UND BR (SUTURE)
SUT VICRYL 2 0 18 UND BR (SUTURE) IMPLANT
TOWEL OR 17X26 10 PK STRL BLUE (TOWEL DISPOSABLE) ×3 IMPLANT
TRAY FOLEY MTR SLVR 16FR STAT (SET/KITS/TRAYS/PACK) IMPLANT
WATER STERILE IRR 1000ML POUR (IV SOLUTION) ×3 IMPLANT
YANKAUER SUCT BULB TIP NO VENT (SUCTIONS) IMPLANT

## 2020-10-09 LAB — BPAM RBC
Blood Product Expiration Date: 202111122359
Blood Product Expiration Date: 202111122359
ISSUE DATE / TIME: 202110230956
ISSUE DATE / TIME: 202110231037
Unit Type and Rh: 7300
Unit Type and Rh: 7300

## 2020-10-09 LAB — EPSTEIN-BARR VIRUS (EBV) ANTIBODY PROFILE
EBV NA IgG: <18 U/mL (ref 0.0–17.9)
EBV VCA IgG: 103 U/mL — ABNORMAL HIGH (ref 0.0–17.9)
EBV VCA IgM: <36 U/mL (ref 0.0–35.9)

## 2020-10-09 LAB — TYPE AND SCREEN
ABO/RH(D): B POS
Antibody Screen: NEGATIVE
Unit division: 0
Unit division: 0

## 2020-10-09 LAB — CALCIUM, IONIZED
Calcium, Ionized, Serum: <3 mg/dL — ABNORMAL LOW (ref 4.5–5.6)
Calcium, Ionized, Serum: <3 mg/dL — ABNORMAL LOW (ref 4.5–5.6)

## 2020-10-09 LAB — CARDIOLIPIN ANTIBODIES, IGG, IGM, IGA
Anticardiolipin IgA: <9 APL U/mL (ref 0–11)
Anticardiolipin IgG: <9 GPL U/mL (ref 0–14)
Anticardiolipin IgM: <9 MPL U/mL (ref 0–12)

## 2020-10-09 LAB — ANA W/REFLEX IF POSITIVE: Anti Nuclear Antibody (ANA): NEGATIVE

## 2020-10-09 LAB — CMV IGM: CMV IgM: <30 AU/mL (ref 0.0–29.9)

## 2020-10-09 NOTE — Progress Notes (Signed)
Patient passed away at 2357 on Oct 27, 2020.  Pronounced by Charge Nurse Army Melia RN and myself Lambert Mody RN).  Patients family had time with Heather Lynn after her passing.  Patient cleaned and all tubes and drains taped/foamed covered.  Patient listening to her favorite band and song.  Her blanket and crystals sent with her and family took other items home with them.

## 2020-10-09 NOTE — Progress Notes (Signed)
Medical examiner Waldemar Dickens contacted at [phone] and Howard Pouch [facility]) Mikki Harbor contacted at [phone]. Patient is a potential tissue only donor.

## 2020-10-09 NOTE — Progress Notes (Signed)
Fentanyl 150 and Versed 50 wasted by Kathrynn Running RN and [employer]

## 2020-10-10 ENCOUNTER — Encounter (HOSPITAL_COMMUNITY): Payer: Self-pay | Admitting: Surgery

## 2020-10-10 LAB — CMV DNA, QUANTITATIVE, PCR
CMV DNA Quant: NEGATIVE IU/mL
Log10 CMV Qn DNA Pl: UNDETERMINED log10 IU/mL

## 2020-10-10 LAB — MISC LABCORP TEST (SEND OUT)
Labcorp test code: 16592
Labcorp test code: 16667

## 2020-10-11 LAB — PREPARE FRESH FROZEN PLASMA

## 2020-10-11 LAB — BPAM FFP
Blood Product Expiration Date: 202110282359
Unit Type and Rh: 7300

## 2020-10-11 LAB — CULTURE, BLOOD (SINGLE)
Culture: NO GROWTH
Special Requests: ADEQUATE

## 2020-10-11 LAB — EHRLICHIA ANTIBODY PANEL
E chaffeensis (HGE) Ab, IgG: NEGATIVE
E chaffeensis (HGE) Ab, IgM: NEGATIVE
E. Chaffeensis (HME) IgM Titer: NEGATIVE
E.Chaffeensis (HME) IgG: NEGATIVE

## 2020-10-12 LAB — EPSTEIN BARR VRS(EBV DNA BY PCR)
EBV DNA QN by PCR: 179 copies/mL
log10 EBV DNA Qn PCR: 2.253 log10 copy/mL

## 2020-10-13 LAB — MISC LABCORP TEST (SEND OUT): Labcorp test code: 138412

## 2020-10-14 LAB — HSV DNA BY PCR (REFERENCE LAB)
HSV 1 DNA: NEGATIVE
HSV 2 DNA: NEGATIVE

## 2020-10-17 LAB — GLUCOSE, CAPILLARY: Glucose-Capillary: <10 mg/dL — CL (ref 70–99)

## 2020-10-17 NOTE — ED Provider Notes (Signed)
[facility]  Department of Emergency Medicine   Code Blue CONSULT NOTE  Chief Complaint: Cardiac arrest/unresponsive   Level V Caveat: Unresponsive  History of present illness: I was contacted by the hospital for a CODE BLUE cardiac arrest upstairs and presented to the patient's bedside.  PT previously intubated, on pressors, receiving CPR on my arrival.  ROS: Unable to obtain, Level V caveat  Scheduled Meds: . sodium chloride   Intravenous Once  . chlorhexidine gluconate (MEDLINE KIT)  15 mL Mouth Rinse BID  . Chlorhexidine Gluconate Cloth  6 each Topical Daily  . hydrocortisone sodium succinate  100 mg Intravenous Q6H  . insulin aspart  0-15 Units Subcutaneous Q4H  . mouth rinse  15 mL Mouth Rinse 10 times per day  . pantoprazole (PROTONIX) IV  40 mg Intravenous Q24H   Continuous Infusions: . sodium chloride Stopped (10/25/2020 1332)  . sodium chloride    . acyclovir Stopped (10/25/2020 1711)  . angiotensin II (GIAPREZA) infusion 40 ng/kg/min (2020/10/25 2048)  . dextrose 100 mL/hr at Oct 25, 2020 2148  . doxycycline (VIBRAMYCIN) IV Stopped (Oct 25, 2020 1710)  . epinephrine 20 mcg/min (2020-10-25 2124)  . fentaNYL infusion INTRAVENOUS Stopped (10-25-20 1001)  . midazolam Stopped (10-25-20 1001)  . norepinephrine (LEVOPHED) Adult infusion 40 mcg/min (10/25/2020 1928)  . phenylephrine (NEO-SYNEPHRINE) Adult infusion 200 mcg/min (Oct 25, 2020 1900)  . piperacillin-tazobactam (ZOSYN)  IV 2.25 g (25-Oct-2020 2206)  . prismasol BGK 4/2.5 2,000 mL/hr at 10/07/20 2350  . sodium bicarbonate in D5W 1000 mL infusion 75 mL/hr at October 25, 2020 1900  . sodium bicarbonate (isotonic) 1000 mL infusion 500 mL/hr at October 25, 2020 0956  . sodium bicarbonate (isotonic) 1000 mL infusion 300 mL/hr at 10/25/2020 0945  . vasopressin 0.03 Units/min (25-Oct-2020 2207)   PRN Meds:.Place/Maintain arterial line **AND** sodium chloride, acetaminophen **OR** acetaminophen, docusate, EPINEPHrine, fentaNYL, fentaNYL (SUBLIMAZE)  injection, heparin, midazolam, polyethylene glycol, sodium chloride Past Medical History:  Diagnosis Date  . Anxiety    Past Surgical History:  Procedure Laterality Date  . WISDOM TOOTH EXTRACTION     Social History   Socioeconomic History  . Marital status: Married    Spouse name: Not on file  . Number of children: Not on file  . Years of education: Not on file  . Highest education level: Not on file  Occupational History  . Not on file  Tobacco Use  . Smoking status: Former Research scientist (life sciences)  . Smokeless tobacco: Never Used  Substance and Sexual Activity  . Alcohol use: Not Currently  . Drug use: Not Currently  . Sexual activity: Not on file  Other Topics Concern  . Not on file  Social History Narrative  . Not on file   Social Determinants of Health   Financial Resource Strain:   . Difficulty of Paying Living Expenses: Not on file  Food Insecurity:   . Worried About Charity fundraiser in the Last Year: Not on file  . Ran Out of Food in the Last Year: Not on file  Transportation Needs:   . Lack of Transportation (Medical): Not on file  . Lack of Transportation (Non-Medical): Not on file  Physical Activity:   . Days of Exercise per Week: Not on file  . Minutes of Exercise per Session: Not on file  Stress:   . Feeling of Stress : Not on file  Social Connections:   . Frequency of Communication with Friends and Family: Not on file  . Frequency of Social Gatherings with Friends and Family: Not on  file  . Attends Religious Services: Not on file  . Active Member of Clubs or Organizations: Not on file  . Attends Archivist Meetings: Not on file  . Marital Status: Not on file  Intimate Partner Violence:   . Fear of Current or Ex-Partner: Not on file  . Emotionally Abused: Not on file  . Physically Abused: Not on file  . Sexually Abused: Not on file   Allergies  Allergen Reactions  . Lamictal [Lamotrigine]     Severe anger    Last set of Vital Signs (not  current) Vitals:   10-13-2020 1900 October 13, 2020 2020  BP:    Pulse: (!) 112 (!) 102  Resp: (!) 31 (!) 35  Temp: (!) 95.7 F (35.4 C)   SpO2: (!) 67%       Physical Exam  Gen: unresponsive Cardiovascular: pulseless, CPR in progress Resp: intubated, on vent Abd: distended, open laparotomy wound w/ wound vac in place Neuro: GCS 3T, unresponsive to pain    Medical Decision making  I identified myself to the team. A critical care physician was present and was running code. Epi, bicarb, and calcium administered. CPR continued. ED staff provided assistance with compressions. CCM voiced no ongoing needs, therefore left ICU team to continue code.     Gwenn Teodoro, Wenda Overland, MD 10/13/2020 2248

## 2020-10-17 NOTE — Progress Notes (Signed)
RT unable to obtain ABG, Elink RN Jodi aware.  RT unable to doppler radial pulses to even attempt radial line insertion.  RT to monitor and assess as needed.

## 2020-10-17 NOTE — Transfer of Care (Signed)
Immediate Anesthesia Transfer of Care Note  Patient: Heather Lynn  Procedure(s) Performed: laparatomy application abdominal wound vacuum (N/A Abdomen)  Patient Location: ICU  Anesthesia Type:General  Level of Consciousness: sedated  Airway & Oxygen Therapy: Patient remains intubated per anesthesia plan  Post-op Assessment: Report given to RN  Post vital signs: Reviewed  Last Vitals:  Vitals Value Taken Time  BP    Temp    Pulse 133 10-29-20 1210  Resp 35 10-29-2020 1210  SpO2      Last Pain:  Vitals:   Oct 29, 2020 1100  TempSrc: Bladder  PainSc:          Complications: No complications documented.

## 2020-10-17 NOTE — Progress Notes (Signed)
General Surgery [facility]  Notified by nursing that repeat intravesical pressure now 41 despite max sedation.  Discussed with Dr. Francella Solian from CCM.  Suspect abdominal compartment syndrome compromising perfusion and ventilation efforts.  Discussed with family at bedside including husband and mother.  They understand that this is an extraordinary measure to try to help the patient since all other modalities are essentially maximized at this time.  Discussed the procedure, need for anesthesia, possible return to OR for dressing change or abdominal closure in several days.  They understand and agree to proceed with surgery.  I have discussed with anesthesia, OR nursing, and my partners.  Will proceed as soon as all parties are prepared.  Darnell Level, MD [facility] Office: [phone]

## 2020-10-17 NOTE — Progress Notes (Signed)
Pharmacy Antibiotic Note  Heather Lynn is a 19 y.o. female admitted on 09/16/2020 with pancreatitis.  Pharmacy consulted 10/22 for zosyn dosing. 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 Pt intubated, on CRRT, on epi/levo/neo/vaso/giaprezza Hypothermic, PCT down, WBC down  Plan: Add acyclovir 5 mg/kg ABW IV q24 = 350 mg IV q24 Continue doxycycline 100 mg per tube q12 per MD Continue Zosyn 3.375 gm IV q6h - infuse each dose over 30 minutes- CRRT dose F/u renal status, WBC, temp, culture data  Height: 5' 4.02" (162.6 cm) Weight: 83.9 kg (184 lb 15.5 oz) IBW/kg (Calculated) : 54.74  Temp (24hrs), Avg:95.5 F (35.3 C), Min:94.1 F (34.5 C), Max:96.6 F (35.9 C)  Recent Labs  Lab 10/06/20 0744 10/06/20 0744 10/06/20 1427 10/06/20 1427 10/06/20 1814 10/06/20 2108 10/07/20 0220 10/07/20 0220 10/07/20 0229 10/07/20 0559 10/07/20 1315 10/12/20 0256 10-12-20 0323 2020/10/12 0550  WBC 40.3*  --  49.1*  --   --   --  40.5*  --   --  32.6*  --   --  27.1*  --   CREATININE 0.78   < > 1.44*   < >  --   --  3.02*  3.09*   < > 3.04* 3.65* 2.60*  2.57* 2.03*  --  1.92*  LATICACIDVEN  --   --   --   --  10.9* 9.2*  --   --   --   --   --   --   --   --    < > = values in this interval not displayed.    Estimated Creatinine Clearance: 49 mL/min (A) (by C-G formula based on SCr of 1.92 mg/dL (H)).    Allergies  Allergen Reactions  . Lamictal [Lamotrigine]     Severe anger   Antimicrobials this admission:  10/21 zosyn x 2 doses; 10/22>> 10/21 meropenem>10/22 10/22 doxy per tube>> 10/23 acyclovir>> Dose adjustments this admission:   Microbiology results:  10/21 covid/flu neg 10/21 BCx1: ngtd 10/21 HIV NR 10/21 MRSA neg  Thank you for allowing pharmacy to be a part of this patient's care.  Herby Abraham, Pharm.D 10/12/20 11:14 AM

## 2020-10-17 NOTE — Progress Notes (Signed)
eLink Physician-Brief Progress Note Patient Name: Heather Lynn DOB: Dec 24, 2000 MRN: [id number]   Date of Service  10/05/2020  HPI/Events of Note  Patient is hypotensive secondary to severe septic shock, she has received aggressive crystalloid volume resuscitation over the past 24 hours but now has increased intra-abdominal pressures likely from capillary leak.  eICU Interventions  Albumin 5 %  500 ml iv bolus x 1 now.        [name] Quinesha Selinger 09/26/2020, 12:54 AM

## 2020-10-17 NOTE — Progress Notes (Signed)
Assessment & Plan: HD#3 - Severe acute pancreatitis with shock and lactic acidosis  - severe acute pancreatitis of undetermined etiology  - last bladder pressure 19, was 25 yesterday - nursing will monitor today  - discussed limited role for surgery in this circumstance with family and CCM  - will follow Hx of anxiety/depression Acute renal failure - CRRT per nephrology and CCM VDRF - vent per CCM   FEN: NPO, IVF, NGT to LIWS VTE: SQ heparin ID: Zosyn 10/22>>        Darnell Level, MD       [facility]       Office: [phone]   Chief Complaint: Acute pancreatitis  Subjective: Patient intubated, sedated.  Family and nursing at bedside.  CCM in unit.  Objective: Vital signs in last 24 hours: Temp:  [94.1 F (34.5 C)-99.3 F (37.4 C)] 94.5 F (34.7 C) (10/23 0738) Pulse Rate:  [103-128] 116 (10/23 0738) Resp:  [23-37] 30 (10/23 0738) BP: (85-104)/(42-59) 104/42 (10/23 0738) SpO2:  [72 %-100 %] 83 % (10/23 0738) Arterial Line BP: (65-125)/(45-72) 79/56 (10/23 0725) FiO2 (%):  [50 %-100 %] 100 % (10/23 0738) Last BM Date: 10/07/20  Intake/Output from previous day: 10/22 0701 - 10/23 0700 In: 12765.4 [I.V.:9900.4; NG/GT:105; IV Piggyback:2699.9] Out: 3431 [Urine:70; Emesis/NG output:200] Intake/Output this shift: No intake/output data recorded.  Physical Exam: HEENT - sclerae clear, mucous membranes moist Neck - soft Abdomen - tense, obese, quiet Ext - no edema, non-tender  Lab Results:  Recent Labs    10/07/20 0559 10/13/20 0125 10/13/20 0323 13-Oct-2020 0358  WBC 32.6*  --  27.1*  --   HGB 9.4*   < > 7.7* 6.8*  HCT 30.4*   < > 24.9* 20.0*  PLT 198  --  146*  --    < > = values in this interval not displayed.   BMET Recent Labs    Oct 13, 2020 0256 10-13-2020 0256 10/13/20 0358 10/13/20 0550  NA 140   < > 138 138  K 3.9   < > 3.2* 3.5  CL 88*  --   --  88*  CO2 22  --   --  21*  GLUCOSE 56*  --   --  182*  BUN 14  --   --  13    CREATININE 2.03*  --   --  1.92*  CALCIUM 5.0*  --   --  4.9*   < > = values in this interval not displayed.   PT/INR No results for input(s): LABPROT, INR in the last 72 hours. Comprehensive Metabolic Panel:    Component Value Date/Time   NA 138 2020-10-13 0550   NA 138 10/13/20 0358   K 3.5 2020/10/13 0550   K 3.2 (L) 10/13/2020 0358   CL 88 (L) 2020-10-13 0550   CL 88 (L) 2020-10-13 0256   CO2 21 (L) October 13, 2020 0550   CO2 22 10/13/2020 0256   BUN 13 10/13/2020 0550   BUN 14 2020-10-13 0256   CREATININE 1.92 (H) 10/13/20 0550   CREATININE 2.03 (H) 2020-10-13 0256   GLUCOSE 182 (H) 10-13-20 0550   GLUCOSE 56 (L) October 13, 2020 0256   CALCIUM 4.9 (LL) October 13, 2020 0550   CALCIUM 5.0 (LL) Oct 13, 2020 0256   AST 7,099 (H) 10/13/2020 0256   AST 3,991 (H) 10/07/2020 1315   ALT 2,715 (H) 10/13/2020 0256   ALT 1,471 (H) 10/07/2020 1315   ALKPHOS 68 Oct 13, 2020 0256   ALKPHOS 47 10/07/2020 1315  BILITOT 2.0 (H) 10-30-20 0256   BILITOT 1.4 (H) 10/07/2020 1315   PROT 4.1 (L) 10/30/2020 0256   PROT 4.9 (L) 10/07/2020 1315   ALBUMIN 2.6 (L) 10-30-20 0550   ALBUMIN 2.6 (L) October 30, 2020 0256    Studies/Results: DG Chest Port 1 View  Result Date: 10/07/2020 CLINICAL DATA:  Respiratory failure EXAM: PORTABLE CHEST 1 VIEW COMPARISON:  Earlier today FINDINGS: A dialysis catheter has been placed on the right with tip at the upper cavoatrial junction. Left IJ line with tip at the lower right atrium. Enteric tube tip at the clavicular heads. Orogastric tube as seen on comparison abdominal radiograph. Stable low volume chest with atelectasis. No air leak seen. Normal heart size IMPRESSION: 1. New dialysis catheter with tip at the upper cavoatrial junction. 2. Left IJ line with tip at the lower right atrium. 3. Stable low volume lungs.  No pneumothorax. Electronically Signed   By: Marnee Spring M.D.   On: 10/07/2020 06:01   DG Chest Port 1 View  Result Date: 10/07/2020 CLINICAL DATA:   Intubation and central line placement EXAM: PORTABLE CHEST 1 VIEW COMPARISON:  10/12/2020 FINDINGS: Endotracheal tube placement with tip measuring 1.4 cm above the carina. Left central venous catheter placed with tip over the right atrium. Cardiac enlargement. Shallow inspiration with atelectasis in the lung bases. No pleural effusions. No pneumothorax. Mediastinal contours appear intact. IMPRESSION: Appliances as above. Cardiac enlargement. Shallow inspiration with atelectasis in the lung bases. Electronically Signed   By: Burman Nieves M.D.   On: 10/07/2020 04:22   DG Abd Portable 1V  Result Date: 10/07/2020 CLINICAL DATA:  Orogastric tube placement EXAM: PORTABLE ABDOMEN - 1 VIEW COMPARISON:  None. FINDINGS: Orogastric tube tip at the mid duodenum with side port near the bulb. Relative paucity of abdominal bowel gas. No concerning mass effect or gas collection IMPRESSION: Orogastric tube with tip and side port at the duodenum. Electronically Signed   By: Marnee Spring M.D.   On: 10/07/2020 05:59   ECHOCARDIOGRAM COMPLETE  Result Date: 10/07/2020    ECHOCARDIOGRAM REPORT   Patient Name:   ECHO PROPP Date of Exam: 10/07/2020 Medical Rec #:  [id number]     Height:       64.0 in Accession #:    [id number]    Weight:       185.0 lb Date of Birth:  2001/02/15      BSA:          1.892 m Patient Age:    19 years      BP:           87/46 mmHg Patient Gender: F             HR:           103 bpm. Exam Location:  Inpatient Procedure: 2D Echo, Cardiac Doppler, Color Doppler and Intracardiac            Opacification Agent Indications:    R94.31 Abnormal EKG  History:        Patient has no prior history of Echocardiogram examinations.  Sonographer:    Eulah Pont RDCS Referring Phys: [id number] Gardiner Ramus MATHEWS IMPRESSIONS  1. Left ventricular ejection fraction, by estimation, is 60 to 65%. The left ventricle has normal function. The left ventricle has no regional wall motion abnormalities. Left ventricular  diastolic parameters are consistent with Grade I diastolic dysfunction (impaired relaxation).  2. Right ventricular systolic function is normal. The right ventricular size is normal. Tricuspid regurgitation  signal is inadequate for assessing PA pressure.  3. The mitral valve is normal in structure. No evidence of mitral valve regurgitation. No evidence of mitral stenosis.  4. The aortic valve is normal in structure. Aortic valve regurgitation is not visualized. No aortic stenosis is present. FINDINGS  Left Ventricle: Left ventricular ejection fraction, by estimation, is 60 to 65%. The left ventricle has normal function. The left ventricle has no regional wall motion abnormalities. Definity contrast agent was given IV to delineate the left ventricular  endocardial borders. The left ventricular internal cavity size was normal in size. There is no left ventricular hypertrophy. Left ventricular diastolic parameters are consistent with Grade I diastolic dysfunction (impaired relaxation). Normal left ventricular filling pressure. Right Ventricle: The right ventricular size is normal. No increase in right ventricular wall thickness. Right ventricular systolic function is normal. Tricuspid regurgitation signal is inadequate for assessing PA pressure. Left Atrium: Left atrial size was normal in size. Right Atrium: Right atrial size was normal in size. Pericardium: There is no evidence of pericardial effusion. Mitral Valve: The mitral valve is normal in structure. No evidence of mitral valve regurgitation. No evidence of mitral valve stenosis. Tricuspid Valve: The tricuspid valve is normal in structure. Tricuspid valve regurgitation is trivial. No evidence of tricuspid stenosis. Aortic Valve: The aortic valve is normal in structure. Aortic valve regurgitation is not visualized. No aortic stenosis is present. Pulmonic Valve: The pulmonic valve was normal in structure. Pulmonic valve regurgitation is trivial. No evidence of  pulmonic stenosis. Aorta: The aortic root is normal in size and structure. Venous: The inferior vena cava was not well visualized. IAS/Shunts: No atrial level shunt detected by color flow Doppler.  LEFT VENTRICLE PLAX 2D LVIDd:         3.20 cm  Diastology LVIDs:         2.30 cm  LV e' medial:    4.43 cm/s LV PW:         1.00 cm  LV E/e' medial:  9.4 LV IVS:        0.90 cm  LV e' lateral:   6.15 cm/s LVOT diam:     1.70 cm  LV E/e' lateral: 6.8 LV SV:         35 LV SV Index:   18 LVOT Area:     2.27 cm  RIGHT VENTRICLE RV S prime:     14.80 cm/s TAPSE (M-mode): 1.4 cm LEFT ATRIUM             Index      RIGHT ATRIUM          Index LA diam:        2.90 cm 1.53 cm/m RA Area:     5.79 cm LA Vol (A2C):   18.6 ml 9.83 ml/m RA Volume:   8.68 ml  4.59 ml/m LA Vol (A4C):   16.5 ml 8.72 ml/m LA Biplane Vol: 18.3 ml 9.67 ml/m  AORTIC VALVE LVOT Vmax:   109.00 cm/s LVOT Vmean:  70.700 cm/s LVOT VTI:    0.153 m  AORTA Ao Root diam: 2.50 cm Ao Asc diam:  2.00 cm MITRAL VALVE MV Area (PHT): 1.36 cm    SHUNTS MV Decel Time: 556 msec    Systemic VTI:  0.15 m MV E velocity: 41.80 cm/s  Systemic Diam: 1.70 cm MV A velocity: 59.10 cm/s MV E/A ratio:  0.71 Armanda Magic MD Electronically signed by Armanda Magic MD Signature Date/Time: 10/07/2020/1:39:38 PM    Final  Darnell Levelodd Jahleel Stroschein 09/17/2020  Patient ID: Kathlen ModySkyler Stucke, female   DOB: 12/31/2000, 19 y.o.   MRN: [id number]

## 2020-10-17 NOTE — Progress Notes (Addendum)
Spoke with family: husband Scarlette Calico  and mother Bonita Quin, explained that Sharonann  has deteriorated through out the night, requiring increased pressure support.. Family states that they are on there way to hospital.    0630 : Offered [facility] services to husband at this time, he state that he is not ready at this time .

## 2020-10-17 NOTE — Progress Notes (Signed)
Called to bedside for femoral arterial line placement.  No waveform on L radial art line.   Dense diffuse edema in periphery diffusely.  Unable to access femoral arteries at all. R Axillary intermitently visible and pulsatile.   Patient seems to be near code situation.  INR was 7.1 on last check this afternoon.  FFP ordered.    Patient became increasingly bradycardic until developed PEA cardiac arrest.  Cardiac arrest per code sheet.   Cont for 22 min, epi given x 6, bicarb x 2.  calcium x1 No PTX no tamonade seen on bedside echo.   Post arrest, there was a slight pulse at the axilla with the doppler. Minutes later I was unable to doppler a  pulse. HR on monitor 110s.    Discussed with family,  Given current max therapy and severity of illness, additional cpr would be futile.  Family gathered at bedside.   DNR going forward.

## 2020-10-17 NOTE — Plan of Care (Signed)
Explained to family the situation that patient status was deteriorating and signs of what would happen prior to coding the patient and during the code with some teach back displayed.  Husband and mom still in denial phase of the severity of the situation at this time understandably due to patient's age.

## 2020-10-17 NOTE — Progress Notes (Signed)
E-link MD contacted for patient's condition worsening.  Need for new ART Line and current abdominal pressure going from 17 to 33 with last check.  CVP from 22 to 23.  New orders placed potential ground team coming from PCCM to see the patient with family in the room.

## 2020-10-17 NOTE — Progress Notes (Signed)
eLink Physician-Brief Progress Note Patient Name: Heather Lynn DOB: 02/15/2001 MRN: [id number]   Date of Service  10/18/20  HPI/Events of Note  K+ 3.2, Ca++ 0.54, glucose 56  eICU Interventions  KCL 10 meq Q 1 hour x 3, Calcium gluconate 2 gm iv x 1, D 10 % infusion previously started and now increased to 100 ml / hour.        [name] [name] Oct 18, 2020, 4:42 AM

## 2020-10-17 NOTE — Progress Notes (Signed)
[facility] for Infectious Disease  Date of Admission:  10/12/2020      Lines:  10/21-c left triple lumen cvc 10/21-c peripherals 10/21-c foley catheter 10/21-c ETT  Abx: 10/22-c piptazo  10/21-22 meropenem                                                    Assessment: 19 yo female admitted 10/21 with septic shock in setting severe pancreatitis, course complicated by aki requiring dialysis and respiratory failure requiring mechanical ventillation  Over the past 24 hours since admission, she continues to have severe septic shock with multiorgan failure, including hepatitis, aki, respiratory failure. I am unclear if she is developing acute hepatic failure  She remains on multiple pressors, and there is also a concern for intraabd compartment syndrome which she'll get surgery today  Fever curve improved (although today 10/23 hypothermic); procalcitonin had trended down along with wbc on empiric piptazo.   I discussed with Dr. Melvyn Novas of Pulm/ICU. It is difficult to put together a good etiology for this. Given how sick she has become especially the severe transaminitis and on going shock despite piptazo/doxy, despite low suspicion for ID cause, I will send more ID serology to include the herpes virus/ebv/cmv. There is pending tick serology  Hep A total ab positive; will check igm level. Hep c/b acute panel negative. hiv negative  I had also sent previously ana/anca/lupus anticoagulant for her. IGG4 sent by GI was normal.   If blood culture remains negative (and there is no blockage on imaging of biliary ducts), I will stop piptazo. But will continue on doxy at least for another few days. I will also add acyclovir empirically on for potential HSV process although she is not a typical host for disseminated hsv   Plan: 1. Continue piptazo; if blood culture negative tomorrow, can stop it 2. Continue doxycycline 100 mg bid 3. Add acyclovir 5 mg/kg iv daily (cvvhd  dosing) for empiric hsv coverage 4. f/u ehrlichia pcr & igg/igm, hep a IgM, RMSF serology 5. f/u ana, anca, lupus anticoagulant panels 6. F/u final blood cx 7. Send hsv 1 and 2 blood pcr, ebv/cmv pcr, ebv panel, cmv igm today 8. Send INR/cpk  Principal Problem:   Acute pancreatitis Active Problems:   Pancreatitis   Severe sepsis with septic shock (HCC)   AKI (acute kidney injury) ([place])   Scheduled Meds: . chlorhexidine gluconate (MEDLINE KIT)  15 mL Mouth Rinse BID  . Chlorhexidine Gluconate Cloth  6 each Topical Daily  . doxycycline  100 mg Per Tube BID  . insulin aspart  0-15 Units Subcutaneous Q4H  . mouth rinse  15 mL Mouth Rinse 10 times per day  . pantoprazole (PROTONIX) IV  40 mg Intravenous Q24H   Continuous Infusions: . sodium chloride Stopped (10/14/20 0505)  . sodium chloride    . acyclovir    . angiotensin II (GIAPREZA) infusion 30 ng/kg/min (10/14/20 0900)  . calcium gluconate 4 g (10-14-20 1032)  . dextrose 100 mL/hr at 10/14/2020 1050  . epinephrine 20 mcg/min (10-14-20 0900)  . fentaNYL infusion INTRAVENOUS 50 mcg/hr (10-14-20 0900)  . midazolam 0.5 mg/hr (2020/10/14 0900)  . norepinephrine (LEVOPHED) Adult infusion 40 mcg/min (10/14/2020 0900)  . phenylephrine (NEO-SYNEPHRINE) Adult infusion 200 mcg/min (10/14/2020 0900)  . piperacillin-tazobactam Stopped (2020/10/14 0535)  .  prismasol BGK 4/2.5 2,000 mL/hr at 10/07/20 2350  . sodium bicarbonate in D5W 1000 mL infusion 75 mL/hr at October 30, 2020 0900  . sodium bicarbonate (isotonic) 1000 mL infusion 500 mL/hr at 2020-10-30 0956  . sodium bicarbonate (isotonic) 1000 mL infusion 300 mL/hr at 10/30/2020 0945  . vasopressin 0.03 Units/min (Oct 30, 2020 1002)   PRN Meds:.Place/Maintain arterial line **AND** sodium chloride, acetaminophen **OR** acetaminophen, docusate, EPINEPHrine, fentaNYL, fentaNYL (SUBLIMAZE) injection, heparin, midazolam, polyethylene glycol, sodium chloride, sodium chloride irrigation   SUBJECTIVE: Very ill, on  multiple pressors, hypothermic Rising lft to 7000s Hep a total ab positive  Review of Systems: ROS Unable to get due to intubation/sedation  Allergies  Allergen Reactions  . Lamictal [Lamotrigine]     Severe anger    OBJECTIVE: Vitals:   10-30-2020 1035 2020-10-30 1040 30-Oct-2020 1042 Oct 30, 2020 1044  BP:      Pulse:      Resp: (!) 35 (!) 35 (!) 35 (!) 35  Temp: (!) 94.6 F (34.8 C) (!) 94.6 F (34.8 C) (!) 94.6 F (34.8 C) (!) 94.8 F (34.9 C)  TempSrc:   Bladder Bladder  SpO2:      Weight:      Height:       Body mass index is 31.73 kg/m.  Physical Exam Patient acutely ill appearing, on vent. Remains on cvvhd Obese Normocephalic; per; conj clear; ett intact Neck supple; left jvp present Tachy/regular no mrg Lungs clear on vent abd distended  Ext no edema Skin no rash Psych/Neuro unable to examine due to sedation  Foley/ngt/ett/left ij  Lab Results Lab Results  Component Value Date   WBC 27.1 (H) 2020/10/30   HGB 6.8 (LL) October 30, 2020   HCT 20.0 (L) October 30, 2020   MCV 85.6 10-30-20   PLT 146 (L) 2020-10-30    Lab Results  Component Value Date   CREATININE 1.92 (H) 10-30-2020   BUN 13 Oct 30, 2020   NA 138 2020/10/30   K 3.5 October 30, 2020   CL 88 (L) 10-30-2020   CO2 21 (L) 10-30-2020    Lab Results  Component Value Date   ALT 2,715 (H) Oct 30, 2020   AST 7,099 (H) 10-30-2020   ALKPHOS 68 10-30-20   BILITOT 2.0 (H) 10-30-20     Microbiology: Recent Results (from the past 240 hour(s))  Respiratory Panel by RT PCR (Flu A&B, Covid) - Nasopharyngeal Swab     Status: None   Collection Time: 10/06/20  3:04 AM   Specimen: Nasopharyngeal Swab  Result Value Ref Range Status   SARS Coronavirus 2 by RT PCR NEGATIVE NEGATIVE Final    Comment: (NOTE) SARS-CoV-2 target nucleic acids are NOT DETECTED.  The SARS-CoV-2 RNA is generally detectable in upper respiratoy specimens during the acute phase of infection. The lowest concentration of SARS-CoV-2 viral  copies this assay can detect is 131 copies/mL. A negative result does not preclude SARS-Cov-2 infection and should not be used as the sole basis for treatment or other patient management decisions. A negative result may occur with  improper specimen collection/handling, submission of specimen other than nasopharyngeal swab, presence of viral mutation(s) within the areas targeted by this assay, and inadequate number of viral copies (<131 copies/mL). A negative result must be combined with clinical observations, patient history, and epidemiological information. The expected result is Negative.  Fact Sheet for Patients:  PinkCheek.be  Fact Sheet for Healthcare Providers:  GravelBags.it  This test is no t yet approved or cleared by the [place] FDA and  has been authorized for detection  and/or diagnosis of SARS-CoV-2 by FDA under an Emergency Use Authorization (EUA). This EUA will remain  in effect (meaning this test can be used) for the duration of the COVID-19 declaration under Section 564(b)(1) of the Act, 21 U.S.C. section 360bbb-3(b)(1), unless the authorization is terminated or revoked sooner.     Influenza A by PCR NEGATIVE NEGATIVE Final   Influenza B by PCR NEGATIVE NEGATIVE Final    Comment: (NOTE) The Xpert Xpress SARS-CoV-2/FLU/RSV assay is intended as an aid in  the diagnosis of influenza from Nasopharyngeal swab specimens and  should not be used as a sole basis for treatment. Nasal washings and  aspirates are unacceptable for Xpert Xpress SARS-CoV-2/FLU/RSV  testing.  Fact Sheet for Patients: PinkCheek.be  Fact Sheet for Healthcare Providers: GravelBags.it  This test is not yet approved or cleared by the [place] FDA and  has been authorized for detection and/or diagnosis of SARS-CoV-2 by  FDA under an Emergency Use Authorization (EUA). This  EUA will remain  in effect (meaning this test can be used) for the duration of the  Covid-19 declaration under Section 564(b)(1) of the Act, 21  U.S.C. section 360bbb-3(b)(1), unless the authorization is  terminated or revoked. Performed at [facility], [place] [address] [place], [place] [address]   Culture, blood (single)     Status: None (Preliminary result)   Collection Time: 10/06/20  3:01 PM   Specimen: BLOOD  Result Value Ref Range Status   Specimen Description   Final    BLOOD LEFT ANTECUBITAL Performed at [facility] [address] [place], [place] [address]    Special Requests   Final    BOTTLES DRAWN AEROBIC AND ANAEROBIC Blood Culture adequate volume Performed at [facility] [address], [place], [place] [address]    Culture   Final    NO GROWTH 2 DAYS Performed at [facility] [address], [place], [place] [address]    Report Status PENDING  Incomplete  MRSA PCR Screening     Status: None   Collection Time: 10/06/20 10:47 PM   Specimen: Nasal Mucosa; Nasopharyngeal  Result Value Ref Range Status   MRSA by PCR NEGATIVE NEGATIVE Final    Comment:        The GeneXpert MRSA Assay (FDA approved for NASAL specimens only), is one component of a comprehensive MRSA colonization surveillance program. It is not intended to diagnose MRSA infection nor to guide or monitor treatment for MRSA infections. Performed at [facility], [place] [address], [place], [place] [address]     Serology: 10/23 pending hep a IgM 10/23 pending hsv 1&2 pcr, ebv panel, cmv igm, ebv/cmv pcr 10/22 hep a total ab positive; hep b sAb/sAg/cAb, hep cAb nonreactive 78/47 pending ehrlichia pcr, igg/igm; rmsf igg/igm 10/21 hiv negative   Jabier Mutton, MD [facility] [place] [phone] pager    21-Oct-2020, 10:58 AM

## 2020-10-17 NOTE — Progress Notes (Signed)
Family kept informed and comforted prior, during and after the code.  Family noticed tear ducts having blood and decided to stop all IVF, pressers and ventilator understanding the end results.  CN and AC in constant communication and support for family as well.

## 2020-10-17 NOTE — Anesthesia Postprocedure Evaluation (Signed)
Anesthesia Post Note  Patient: Company secretary  Procedure(s) Performed: laparatomy application abdominal wound vacuum (N/A Abdomen)     Patient location during evaluation: ICU Anesthesia Type: General Level of consciousness: sedated and patient remains intubated per anesthesia plan Pain management: pain level controlled Vital Signs Assessment: post-procedure vital signs reviewed and stable Respiratory status: patient on ventilator - see flowsheet for VS and patient remains intubated per anesthesia plan Cardiovascular status: blood pressure returned to baseline and stable Postop Assessment: no apparent nausea or vomiting Anesthetic complications: no Comments: Patient transported back to ICU intubated and sedated, on the same pressors and drips as preoperatively. Full report to RN and bedside team, no issues under anesthesia.    No complications documented.  Last Vitals:  Vitals:   09/27/2020 1100 09/19/2020 1210  BP:    Pulse: (!) 35 (!) 133  Resp: (!) 35 (!) 35  Temp: (!) 34.9 C   SpO2: (!) 32%     Last Pain:  Vitals:   10/09/2020 1100  TempSrc: Bladder  PainSc:                  Lannie Fields

## 2020-10-17 NOTE — Progress Notes (Addendum)
eLink Physician-Brief Progress Note Patient Name: Devyne Hauger DOB: May 31, 2001 MRN: [id number]   Date of Service  09/30/2020  HPI/Events of Note  Lost L Radial Aline, unable to doppler pulses to R Radial, RN asking for fem aline    RT unable to stick for ABG's, unable to get reliable cuff B/P   on Giapreza, epi , levo, neo, vasopressin  eICU Interventions  - notified bed side ICU Dr Joni Reining for fem artery line placement evaluation - CMP, ionc alcium, INR, fibrinogen ordered. Camera eval done also. Art line flat. sats and HR fine.      Intervention Category Minor Interventions: Other:  [name] 09/28/2020, 8:22 PM

## 2020-10-17 NOTE — Progress Notes (Addendum)
Notified MD Wert in regards to patient's BP decreasing. Patient becoming more hypotensive. Wound Vac have steady output of dark blood. MD Wert made aware of this. H and H sent off to check hemoglobin. Patient has been on the highest amounts of pressors (Neosynphrine, Levophed, Vasopressin, Epinephrine and Giapreza). MD Wert ordered to start 1 liter of saline bolus.

## 2020-10-17 NOTE — Progress Notes (Signed)
eLink Physician-Brief Progress Note Patient Name: Essa Wenk DOB: [birth year] MRN: [id number]   Date of Service  10/19/2020  HPI/Events of Note  Patient had another hypotensive spell.  eICU Interventions  Epinephrine 100 mcg iv x 1, stat CBC to r/o hemorrhage, Giapreza ordered.        [name] Zohar Maroney 10/19/20, 3:27 AM

## 2020-10-17 NOTE — Progress Notes (Signed)
CRITICAL VALUE ALERT  Critical Value:  INR 7.1   Date & Time Notied:  10/23 at 1809  Provider Notified: MD Sherene Sires at 1810  Orders Received/Actions taken: Patient's CVP at 23. Patient fluid overloaded. No new orders at this time.

## 2020-10-17 NOTE — Progress Notes (Addendum)
eLink Physician-Brief Progress Note Patient Name: Heather Lynn DOB: 2001/02/13 MRN: [id number]   Date of Service  09/19/2020  HPI/Events of Note  Dr Sherene Sires hand off discussed. Did not transfuse or correct elevated INR due to stable Hg and no bleeding.    eICU Interventions  - Family is aware of Very poor prognosis and might not make it tonight.  Continue current care.      Intervention Category Minor Interventions: Communication with other healthcare providers and/or family  [name] 09/30/2020, 8:47 PM   00:12 Earlier had ACLS for PEA  Arrest with ROSC. Dr Joni Reining made her DNR . Passed away was notified. Nurse is going to call [facility] office.

## 2020-10-17 NOTE — Progress Notes (Addendum)
Notified MD Wert in regards to patient having significant hypotension despite having vasopressors at max and fluid bolus, Hgb at 1305 was 8.9-9.0. Patient still having dark bloody output from wound vac. MD Wert ordered another 1 Liter fluid bolus of normal saline at approximately between 1400-1500. Family at bedside, discussed patient's critical status. Family verbalized understanding of vasopressors being at the highest amounts, fluid boluses being given and blood pressure not responding. MD Wert made aware of CVP of 22 at 1600 and also unable to doppler pulses in pedal, posterior tibial, popliteal on left side. Right side able to obtain pulses with doppler over popliteal region but not pedal, or posterior tibial. Radial pulses are 1+, Aline still has waveform.

## 2020-10-17 NOTE — Progress Notes (Signed)
Charge nurse said they had been trying to reach the chaplain/ As soon as the room settled down from several staff working with her. I went in   10/06/2020 1500  Clinical Encounter Type  Visited With Patient and family together  Visit Type Initial;Spiritual support  Referral From Nurse  Consult/Referral To Chaplain  Spiritual Encounters  Spiritual Needs Prayer  Stress Factors  Family Stress Factors Health changes;Other (Comment) (very concerned)   to give patient and family support. Patient suddenly very ill and family is surrounding her. Husband Heather Lynn and patient's brother and sister present.   Chaplain was asked would you please pray for her? Yes of course- Prayed for patient to feel peace in place of any anxiety and for the patient to respond well to treatment and for the staff and for the family as it is so difficult to see Heather Lynn so sick and so quickly. Rev. Audrea Muscat Staff Chaplain

## 2020-10-17 NOTE — Progress Notes (Addendum)
NAMEMarnae Lynn, MRN:  [id number], DOB:  11-Mar-2001, LOS: 2 ADMISSION DATE:  10/04/2020, CONSULTATION DATE:   10/21 REFERRING MD:  Lanier Clam, MD CHIEF COMPLAINT:  Belly pain   Brief History   19 year old who presented to the ED with severe abdominal pain found to have acute idiopathic pancreatitis.  History of present illness   Patient had onset of belly pain about 1 to 2 days PTA associated with nausea with vomiting with pain   radiation to the back.  No shortness of breath  No fever or chills.  She drinks irregularly, had 3 white claws last week.  No significant binge drinking recently.  She does not do IV drugs.  Does smoke marijuana on occasion.  No obvious medication culprits for pancreatitis on her medication list.  In the ED, she has been intermittently tachycardic.  She is demonstrated saturations in the mid to high 90s on room air.  Her blood pressure has been okay.  Her hemoglobin and hematocrit have increased despite aggressive hydration.  Leukocytosis climbed 20,000-40,000.  Lipase 1300.  No hyperbilirubinemia.  LFTs mildly elevated.  Noted to have some fatty liver on imaging.  Triglycerides only 181.    Past Medical History  [place] [facility] Events   10/21 admit, change to ICU status, start pressors 10/22 Intubated, start ARDS protocol  Consults:  GI Nephrology  Procedures:  ETT 10/22 >> Lt IJ CVL 10/22 >> Arterial line 10/22 >>  HD catheter 10/22 >>>  Significant Diagnostic Tests:   CT abd/pelvis 10/21 >> trace effusions, small ascites, fatty liver, diffuse inflammatory changes of pancreas with peripancreatic fluid and edema  Abd u/s 10/21 >> no gallstones, CBD 5 mm  Micro Data:  COVID /flu PCR  10/21 >> neg  MRSA  PCR  10/21 > neg Blood 10/21 >>  Antimicrobials per ID :  Meropenem 10/21 >> 10/22 Zosyn 10/21 >>> Doxy 10/22  >>>   Scheduled Meds: . chlorhexidine gluconate (MEDLINE KIT)  15 mL Mouth Rinse BID  . Chlorhexidine  Gluconate Cloth  6 each Topical Daily  . doxycycline  100 mg Per Tube BID  . feeding supplement (VITAL HIGH PROTEIN)  1,000 mL Per Tube Q24H  . heparin injection (subcutaneous)  5,000 Units Subcutaneous Q8H  . insulin aspart  0-15 Units Subcutaneous Q4H  . mouth rinse  15 mL Mouth Rinse 10 times per day  . pantoprazole (PROTONIX) IV  40 mg Intravenous Q24H   Continuous Infusions: . sodium chloride Stopped (2020/11/07 0505)  . sodium chloride    . angiotensin II (GIAPREZA) infusion 3 ng/kg/min (2020-11-07 0700)  . dextrose 75 mL/hr at 11/07/2020 0700  . epinephrine 20 mcg/min (11-07-2020 0700)  . fentaNYL infusion INTRAVENOUS 50 mcg/hr (2020/11/07 0700)  . midazolam 0.5 mg/hr (2020-11-07 0700)  . norepinephrine (LEVOPHED) Adult infusion 40 mcg/min (11-07-2020 0700)  . phenylephrine (NEO-SYNEPHRINE) Adult infusion 200 mcg/min (November 07, 2020 0700)  . piperacillin-tazobactam Stopped (11-07-20 0535)  . potassium chloride 50 mL/hr at 11/07/2020 0700  . prismasol BGK 4/2.5 2,000 mL/hr at 10/07/20 2350  . sodium bicarbonate in D5W 1000 mL infusion 75 mL/hr at 07-Nov-2020 0700  . sodium bicarbonate (isotonic) 1000 mL infusion 500 mL/hr at 07-Nov-2020 0604  . sodium bicarbonate (isotonic) 1000 mL infusion 300 mL/hr at 11/07/2020 0338  . vasopressin 0.03 Units/min (Nov 07, 2020 0700)   PRN Meds:.Place/Maintain arterial line **AND** sodium chloride, acetaminophen **OR** acetaminophen, docusate, EPINEPHrine, fentaNYL, fentaNYL (SUBLIMAZE) injection, heparin, midazolam, polyethylene glycol, sodium chloride  Interim history/subjective:  increasing need for pressors now maxed out to included epi and levo pand geopressa / minimal uop  Objective   Blood pressure (!) 104/42, pulse (!) 117, temperature (!) 94.3 F (34.6 C), temperature source Core, resp. rate (!) 30, height 5' 4.02" (1.626 m), weight 83.9 kg, last menstrual period 09/25/2020, SpO2 (!) 82 %. CVP:  [7 mmHg-19 mmHg] 19 mmHg  Vent Mode: PRVC FiO2 (%):  [50 %-100 %] 100  % Set Rate:  [28 bmp-30 bmp] 30 bmp Vt Set:  [380 mL] 380 mL PEEP:  [8 cmH20-14 cmH20] 14 cmH20 Plateau Pressure:  [21 cmH20-32 cmH20] 21 cmH20   Intake/Output Summary (Last 24 hours) at 10-29-2020 0648 Last data filed at 10/29/2020 0600 Gross per 24 hour  Intake 12270.67 ml  Output 3522 ml  Net 8748.67 ml   Filed Weights   10/06/20 0040 10/07/20 0100  Weight: 83.9 kg 83.9 kg   CVP:  [7 mmHg-19 mmHg] 19 mmHg    Examination: Tmax 99.3 (on heating blanket so probably not accurate reflection)  Pt sedated on vent not breathing over backup rate / quite pale Oropharynx et  Neck supple Lungs with distant crackles  bilaterally RRR no s3 or or sign murmur Abd tensely distended  Extr warm on heating blanket/ trace  edema   Neuro  Sensorium sedated no response to verbal   Resolved Hospital Problem list     Assessment & Plan:   Severe acute pancreatitis/ also ? Shock liver (not lfts nearly nl on admit) - no clear inciting event; ?if auto immune versus idiopathic - GI consulted >> nothing to offer - trickle tube feeds > held 10/23 due to distended abd ? Developing compartment syndrome > CCS following  SIRS/sepsis with lactic acidosis from acute pancreatitis. - pressors to keep MAP > 65 - f/u lactic acid - monitor CVP/ bladder pressures   Acute hypoxic respiratory failure in setting of pancreatitis. - ARDS protocol   - goal SpO2 90%    AKI from ATN. Hyperkalemia. Anion gap metabolic acidosis. - baseline creatinine 0.75 from 10/07/2020 - nephrology following, on CVVH since 10/22    Anemia of critical illness.   Lab Results  Component Value Date   HGB 6.8 (LL) 10-29-20   HGB 7.7 (L) October 29, 2020   HGB 7.8 (L) 10/29/20   >   trx 2 units due to refractory hypotension/ hyoxemia (only hope here to improve tissue 02 delivery   Hyperglycemia. - SSI  Sedation while on ventilator. Hx of anxiety. - versed, fentanyl gtt for RASS goal -2 to -3  > looks about right as fare  as vent mechanics today  - holding  outpt klonopin, seroquel, prozac  Discussed with fm short term goals today to trx PRBC's today, correct acidosis to extent possible with vent/ HC03 infusion and monitor bladder pressure for abd compartment syndrome (discussed also with Dr Harlow Asa)   - ask GI to comment on lfts.  However, overall picture is very bleak regardless of mech for pancreatitis which remains obscure, and reaching the limits of what medical science has to offer her.   Best practice:  Diet: trickle tube feeds on hold 10/23 due to tense abd  DVT prophylaxis: lovenox SUP: protonix Mobility: bed rest Family:at bedside am 10/23  Disposition: ICU  Labs    CMP Latest Ref Rng & Units 2020/10/29 10-29-2020 2020-10-29  Glucose 70 - 99 mg/dL 182(H) - 56(L)  BUN 6 - 20 mg/dL 13 - 14  Creatinine 0.44 - 1.00 mg/dL 1.92(H) -  2.03(H)  Sodium 135 - 145 mmol/L 138 138 140  Potassium 3.5 - 5.1 mmol/L 3.5 3.2(L) 3.9  Chloride 98 - 111 mmol/L 88(L) - 88(L)  CO2 22 - 32 mmol/L 21(L) - 22  Calcium 8.9 - 10.3 mg/dL 4.9(LL) - 5.0(LL)  Total Protein 6.5 - 8.1 g/dL - - 4.1(L)  Total Bilirubin 0.3 - 1.2 mg/dL - - 2.0(H)  Alkaline Phos 38 - 126 U/L - - 68  AST 15 - 41 U/L - - 7,099(H)  ALT 0 - 44 U/L - - 2,715(H)    CBC Latest Ref Rng & Units 10/20/20 10-20-20 10-20-2020  WBC 4.0 - 10.5 K/uL - 27.1(H) -  Hemoglobin 12.0 - 15.0 g/dL 6.8(LL) 7.7(L) 7.8(L)  Hematocrit 36 - 46 % 20.0(L) 24.9(L) 23.0(L)  Platelets 150 - 400 K/uL - 146(L) -    ABG    Component Value Date/Time   PHART 7.322 (L) 10/20/20 0358   PCO2ART 53.2 (H) Oct 20, 2020 0358   PO2ART 58 (L) 10/20/2020 0358   HCO3 28.3 (H) Oct 20, 2020 0358   TCO2 30 2020/10/20 0358   ACIDBASEDEF 5.9 (H) 10/07/2020 1119   O2SAT 90.0 10/20/2020 0358    CBG (last 3)  Recent Labs    Oct 20, 2020 0340 10-20-20 0507 October 20, 2020 0523  GLUCAP 155* 61* 129*     The patient is critically ill with multiple organ systems failure and requires high  complexity decision making for assessment and support, frequent evaluation and titration of therapies, application of advanced monitoring technologies and extensive interpretation of multiple databases. Critical Care Time devoted to patient care services described in this note is 45 minutes.    Christinia Gully, MD [facility] [phone]   After 7:00 pm call Elink  [phone]

## 2020-10-17 NOTE — Progress Notes (Signed)
Blood GAS run on I-STAT

## 2020-10-17 NOTE — Progress Notes (Signed)
eLink Physician-Brief Progress Note Patient Name: Heather Lynn DOB: October 28, 2001 MRN: [id number]   Date of Service  10/12/2020  HPI/Events of Note  Patient with severe hypotension.  eICU Interventions  Sodium bicarbonate 100 meq iv x 1, Albumin 5 % 500 ml iv x 1, Epinephrine 100 mcg iv push x 1, Epinephrine infusion ordered, stat ABG / iSTAT 7, Calcium gluconate 2 gm iv x 1 for Ca++ of 0.51,  PEEP increased to 12 for PO2 of 61 mmHg, FIO2 increased to 70 %.        Marshon Bangs U [place] List 2020/10/12, 1:30 AM

## 2020-10-17 NOTE — Progress Notes (Signed)
CRITICAL VALUE STICKER  CRITICAL VALUE: Calcium 4.9  RECEIVER (on-site recipient of call): Katrinka Blazing, RN  DATE & TIME NOTIFIED: 10/01/2020 [phone]   MESSENGER (representative from lab):  MD NOTIFIED: ELink RN  TIME OF NOTIFICATION: 0383  RESPONSE: awaiting orders

## 2020-10-17 NOTE — Progress Notes (Signed)
RN requested Phenylephrine to be concentrated due to high rate of infusion. Have updated order. RN aware concentrated infusion is 10 x more concentrated that bag that is currently infusing and will adjust Alaris pump settings to reflect new concentration once new bag is hung.   Greer Pickerel, PharmD, BCPS 09/25/2020 8:34 AM

## 2020-10-17 NOTE — Progress Notes (Signed)
eLink Physician-Brief Progress Note Patient Name: Heather Lynn DOB: 08-09-2001 MRN: [id number]   Date of Service  10/13/2020  HPI/Events of Note  Severe pancreatitis with evolving multiple organ systems failure and profound hypoxemia.  eICU Interventions  Dr. Sherene Sires updated on patient's condition.        [name] 09/26/2020, 6:45 AM

## 2020-10-17 NOTE — Progress Notes (Signed)
Notified MD Gerkin in regards to patient's intra-abdominal pressure reading of 41. Last known reading on previous shift was 19. Patient's abdomen distended and taut. Also notified MD Wert of value.

## 2020-10-17 NOTE — Op Note (Signed)
Operative Note  Pre-operative Diagnosis:  Abdominal compartment syndrome   Post-operative Diagnosis:  same  Surgeon:  Darnell Level, MD  Assistant:  Elna Breslow, RN   Procedure:  Laparotomy with application of abdominal vacuum dressing  Anesthesia:  general  Estimated Blood Loss:  minimal  Drains: none         Specimen: none  Indications: Patient is a 19 year old female admitted to the medical service with severe acute pancreatitis. Patient has developed tense abdominal distention. Intravesical pressures have risen this morning to greater than 40. Patient is on maximal medical support. After discussion with critical care medicine, decision is made to proceed with abdominal decompression by application of an abdominal vacuum dressing in the operating room.  Procedure:  The patient was seen in the pre-op holding area. The risks, benefits, complications, treatment options, and expected outcomes were previously discussed with the patient. The patient agreed with the proposed plan and has signed the informed consent form.  The patient was brought to the operating room by the surgical team, identified as Nasim Geissinger and the procedure verified. A "time out" was completed and the above information confirmed.  Following induction of general anesthesia, the patient is positioned and then prepped and draped in the usual aseptic fashion. After ascertaining that an adequate level of anesthesia been achieved, a midline abdominal incision is made with a #10 blade. Dissection was carried through subcutaneous tissues using the electrocautery for hemostasis. Fascia is incised in the midline and the peritoneal cavity is entered cautiously. There is a large volume of dark brownish ascitic fluid which is evacuated. A total of 1300 cc of ascites are removed. The small bowel appears viable. There are areas of saponification within the omentum.  Vacuum dressing is prepared. It is deployed beneath the fascia  circumferentially. Foam is placed into the subcutaneous tissues. Wound is covered with occlusive plastic dressings in the usual fashion. Suction is placed to the midportion of the wound and there appears to be a good seal obtained. Tubing is placed to the suction pump in order to transport the patient back upstairs to the intensive care unit.  Good hemostasis was noted throughout the case. Anesthesia will attend the patient on the journey back upstairs to the ICU.   Darnell Level, MD [facility] Office: [phone]

## 2020-10-17 NOTE — Progress Notes (Signed)
Notified Elink that patient unable to maintain spO2 with vent settings at Fio2 100, Peep 14, Resp rate of 30, Tidal Volume 380

## 2020-10-17 NOTE — Progress Notes (Signed)
Patient today in OR for treatment of abdominal compartment syndrome.  Overall prognosis guarded.    At this point, most pressing issue is medical management of pancreatitis by ICU and surgical teams.  No further GI recommendations at this point.  [facility] will follow along at a distance; please call with any questions.

## 2020-10-17 NOTE — Progress Notes (Signed)
Notified MD Schertz in regards to patient too hypotensive to restart CRRT at this time. Patient's systolic BP low 70s. Family made aware as well.

## 2020-10-17 NOTE — Progress Notes (Signed)
eLink Physician-Brief Progress Note Patient Name: Heather Lynn DOB: 2001/04/02 MRN: [id number]   Date of Service  10/07/2020  HPI/Events of Note  Patient's husband and mother are in the room.  eICU Interventions  I updated patient's mother and husband and answered all their questions.        [name] Osie Amparo 10/07/2020, 6:54 AM

## 2020-10-17 NOTE — Progress Notes (Signed)
eLink Physician-Brief Progress Note Patient Name: Heather Lynn DOB: 03/07/2001 MRN: [id number]   Date of Service  2020-10-13  HPI/Events of Note  Bed side RN discussed about CVP, Intra abdominal pressure and asking for Hg post PRBC. On multiple pressors, ex lap, wound vac. Pancreatitis of Unknown etiology. Guarded prognosis, family is aware. Full code.   eICU Interventions  - get Hg/hct, ABG. On 100% fio2. On bicarb gtt.      Intervention Category Intermediate Interventions: Other:;Oliguria - evaluation and management  [name] Oct 13, 2020, 7:57 PM

## 2020-10-17 NOTE — Progress Notes (Signed)
CRITICAL VALUE STICKER  CRITICAL VALUE: calcium 5.0   RECEIVER (on-site recipient of call): Pernell Dupre, RN   DATE & TIME NOTIFIED: 09/30/2020 0400  MESSENGER (representative from lab):  MD NOTIFIED: Marisue Ivan, RN at [facility]  TIME OF NOTIFICATION: 0403  RESPONSE: awaiting orders

## 2020-10-17 NOTE — Anesthesia Preprocedure Evaluation (Signed)
Anesthesia Evaluation  Patient identified by MRN, date of birth, ID band Patient unresponsive    Reviewed: Allergy & Precautions, NPO status , Patient's Chart, lab work & pertinent test results  Airway Mallampati: Intubated       Dental   Pulmonary former smoker,    Pulmonary exam normal breath sounds clear to auscultation       Cardiovascular negative cardio ROS   Rhythm:Regular Rate:Tachycardia     Neuro/Psych PSYCHIATRIC DISORDERS Anxiety negative neurological ROS     GI/Hepatic negative GI ROS, Neg liver ROS,   Endo/Other  negative endocrine ROS  Renal/GU ARF and DialysisRenal diseaseOn CRRT, Cr 4.9 this morning   negative genitourinary   Musculoskeletal negative musculoskeletal ROS (+)   Abdominal (+)  Abdomen: rigid. Bowel sounds: decreased.  Peds negative pediatric ROS (+)  Hematology  (+) Blood dyscrasia, anemia , H/H 6.8/20, plt 146   Anesthesia Other Findings Otherwise healthy 19yoF who presented to ED 2 days ago with abdominal pain, found to have idiopathic pancreatitis and has subsequently rapidly deteriorated over the past 48h. Is now on CRRT and multiple high dose pressors.   Reproductive/Obstetrics negative OB ROS                             Anesthesia Physical Anesthesia Plan  ASA: V and emergent  Anesthesia Plan: General   Post-op Pain Management:    Induction: Intravenous  PONV Risk Score and Plan: Treatment may vary due to age or medical condition  Airway Management Planned: Oral ETT  Additional Equipment: Arterial line and CVP  Intra-op Plan:   Post-operative Plan: Post-operative intubation/ventilation  Informed Consent: I have reviewed the patients History and Physical, chart, labs and discussed the procedure including the risks, benefits and alternatives for the proposed anesthesia with the patient or authorized representative who has indicated his/her  understanding and acceptance.     Dental advisory given  Plan Discussed with: CRNA  Anesthesia Plan Comments: (Access: RIJ TLC, R radial arterial line, PIV x 2, intubated and sedated  Pressors: vaso/phenylephrine/epi/giapreza)        Anesthesia Quick Evaluation

## 2020-10-17 NOTE — Progress Notes (Signed)
Intra-abdominal pressure 17.

## 2020-10-17 NOTE — Progress Notes (Signed)
CRITICAL VALUE ALERT  Critical Value:  Calcium 4.8   Date & Time Notied:  10/23 at 1622  Provider Notified: MD Sherene Sires at 1623  Orders Received/Actions taken: MD Wert ordered 1g of Calcium Chloride to be given.

## 2020-10-17 NOTE — Progress Notes (Signed)
Pharmacy Brief Note:   Pharmacy informed that CRRT was turned off ~1045 this morning prior to surgery and has not been resumed.   Assessing medications for necessary renal dose adjustment for ARF CrCl <10 mL/min while CRRT on hold.    Change piperacillin/tazobactam to 2.25 g IV q8h. Last dose verbally reported as given prior to surgery ~1200 today  Acyclovir 5 mg/kg ABW IV q24h dose is appropriate, no adjustment needed  Cindi Carbon, PharmD October 22, 2020 8:01 PM

## 2020-10-17 NOTE — Progress Notes (Signed)
[facility] Progress Note  Subjective: pt in OR, chart reviewed and have d/w staff   Vitals:   2020-10-24 1042 10-24-20 1044 Oct 24, 2020 1100 2020/10/24 1210  BP:      Pulse:   (!) 35 (!) 133  Resp: (!) 35 (!) 35 (!) 35 (!) 35  Temp: (!) 94.6 F (34.8 C) (!) 94.8 F (34.9 C) (!) 94.8 F (34.9 C)   TempSrc: Bladder Bladder Bladder   SpO2:   (!) 32%   Weight:      Height:        Exam: Pt in OR    CXR - no acute disease, hazy overlay    UA 10/21 - clear, 11-20 rbc, 0-5 wbc, no bact, neg protein    UNa 64    Abd US - kidneys 9.3/ 10.3 cm, no hydro, normal echo     CVP - today 10- 19    I/O since admit >> 20L in and 3.5 L out = + 17 L       Assessment/ Plan: 1. Renal failure - AKI in setting of severe pancreatitis and shock. No prior pmh otherwise. ATN d/ t ischemia. CRRT started 10/22. Not making any urine. Cont CRRT when back from OR.  4/2.5 dialysate at 2000 cc/hr.  2. Severe shock - on 4 pressors, per CCM 3. Metabolic acidosis - improved but pH still 7.2, cont bicarb gtt at 150/hr and bicarb pre/ post fluids w/ CRRT 4. Hyperkalemia - resolved w/ CRRT 5. Acute severe pancreatitis - per CCM, pt very sick 6. Abd compartment syndrome - went to OR today, 1300 cc dark brownish ascitic fluid removed, areas of saponification noted within the omentum. Wound VAC was placed under the fascia.  7. Anemia - supportive care, tx prn 8. Vol overload - up 17 L since admit 3d ago    Rob Roslind Michaux 2020/10/24, 12:53 PM   Recent Labs  Lab 10/07/20 1315 10/07/20 1627 10-24-20 0256 2020/10/24 0323 10-24-20 0550 October 24, 2020 0718 2020/10/24 1125 2020/10/24 1151  K 3.9  3.9  3.9   < > 3.9   < > 3.5   < > 3.7 4.4  BUN 22*  22*   < > 14  --  13  --   --   --   CREATININE 2.60*  2.57*   < > 2.03*  --  1.92*  --   --   --   CALCIUM 5.0*  4.7*   < > 5.0*  --  4.9*  --   --   --   PHOS 5.2*  --   --   --  6.7*  --   --   --   HGB  --    < >  --    < >  --    < > 9.9* 11.2*   < > =  values in this interval not displayed.   Inpatient medications: . chlorhexidine gluconate (MEDLINE KIT)  15 mL Mouth Rinse BID  . Chlorhexidine Gluconate Cloth  6 each Topical Daily  . doxycycline  100 mg Per Tube BID  . insulin aspart  0-15 Units Subcutaneous Q4H  . mouth rinse  15 mL Mouth Rinse 10 times per day  . pantoprazole (PROTONIX) IV  40 mg Intravenous Q24H   . sodium chloride 1,000 mL (2020/10/24 1249)  . sodium chloride    . acyclovir    . angiotensin II (GIAPREZA) infusion 40 ng/kg/min (10-24-2020 1000)  . dextrose 100 mL/hr at Oct 24, 2020  1111  . epinephrine 20 mcg/min (10/22/2020 1111)  . fentaNYL infusion INTRAVENOUS 50 mcg/hr (10/22/2020 1000)  . midazolam 0.5 mg/hr (10/22/2020 1000)  . norepinephrine (LEVOPHED) Adult infusion 40 mcg/min (10-22-20 1243)  . phenylephrine (NEO-SYNEPHRINE) Adult infusion 100 mcg/min (Oct 22, 2020 1140)  . piperacillin-tazobactam Stopped (10-22-2020 0535)  . prismasol BGK 4/2.5 2,000 mL/hr at 10/07/20 2350  . sodium bicarbonate in D5W 1000 mL infusion 75 mL/hr at 10/22/20 1000  . sodium bicarbonate (isotonic) 1000 mL infusion 500 mL/hr at Oct 22, 2020 0956  . sodium bicarbonate (isotonic) 1000 mL infusion 300 mL/hr at 10/22/20 0945  . vasopressin 0.03 Units/min (October 22, 2020 1111)   Place/Maintain arterial line **AND** sodium chloride, acetaminophen **OR** acetaminophen, docusate, EPINEPHrine, fentaNYL, fentaNYL (SUBLIMAZE) injection, heparin, midazolam, polyethylene glycol, sodium chloride

## 2020-10-17 NOTE — Progress Notes (Addendum)
Notified MD Wert about abdominal distention, no bowel sounds, and abdomen very taut at 0700. MD Wert ordered to stop tube feeds. Patient is on dextrose 10% at 159ml/hr. MD Wert ordered to hold insulin.

## 2020-10-17 DEATH — deceased

## 2020-10-20 LAB — DRVVT MIX: dRVVT Mix: 40.7 s — ABNORMAL HIGH (ref 0.0–40.4)

## 2020-10-20 LAB — LUPUS ANTICOAGULANT PANEL
DRVVT: 55.9 s — ABNORMAL HIGH (ref 0.0–47.0)
PTT Lupus Anticoagulant: 46 s (ref 0.0–51.9)

## 2020-10-20 LAB — DRVVT CONFIRM

## 2020-11-03 NOTE — Discharge Summary (Signed)
Physician Discharge Summary         Patient ID: Heather Lynn MRN: [id number] DOB/AGE: 2001-07-24 19 y.o.  Admit date: 10/04/2020 Discharge date:  October 22, 2020     Discharge Diagnoses:   1)  Severe pancreatitis with refractory shock from SIRS  2)  Acute hypoxemic respiratory failure/vent dep secondary to #1 3) Acute renal failure and refractory metabolic acidosis secondary to #1    Brief History   19 year old who presented to the ED with severe abdominal pain found to have acute idiopathic pancreatitis.   History of present illness   Patient had onset of belly pain about 1 to 2 days PTA associated with nausea with vomiting with pain   radiation to the back.  No shortness of breath  No fever or chills.  She drinks irregularly, had 3 white claws last week.  No significant binge drinking recently.  She does not do IV drugs.  Does smoke marijuana on occasion.  No obvious medication culprits for pancreatitis on her medication list.   In the ED, she has been intermittently tachycardic.  She is demonstrated saturations in the mid to high 90s on room air.  Her blood pressure has been okay.  Her hemoglobin and hematocrit have increased despite aggressive hydration.  Leukocytosis climbed 20,000-40,000.  Lipase 1300.  No hyperbilirubinemia.  LFTs mildly elevated.  Noted to have some fatty liver on imaging.  Triglycerides only 181.      Discharge summary   Severe acute pancreatitis/ also ? Shock liver (not lfts nearly nl on admit) - no clear inciting event; ?if auto immune versus idiopathic - GI consulted >> nothing to offer - trickle tube feeds > held 10/23 due to distended abd ? Developing compartment syndrome > CCS following   SIRS/sepsis with lactic acidosis from acute pancreatitis. - pressors to keep MAP > 65 - f/u lactic acid - monitor CVP/ bladder pressures    Acute hypoxic respiratory failure in setting of pancreatitis. - ARDS protocol   - goal SpO2 90%     AKI from  ATN. Hyperkalemia. Anion gap metabolic acidosis. - baseline creatinine 0.75 from 09/22/2020 - nephrology following, on CVVH since 10/22     Anemia of critical illness.   Labs (Brief)       Lab Results  Component Value Date    HGB 6.8 (LL) 2020/10/22    HGB 7.7 (L) 10-22-20    HGB 7.8 (L) 10-22-20     >   trx 2 units due to refractory hypotension/ hyoxemia (only hope here to improve tissue 02 delivery    Hyperglycemia. - SSI   Sedation while on ventilator. Hx of anxiety. - versed, fentanyl gtt for RASS goal -2 to -3  > looks about right as fare as vent mechanics today  - holding  outpt klonopin, seroquel, prozac   Despite the above efforts which also included input by gen surgery leading to laparotomy to decompress the abdominal compartment she developed refractory shock and expired with all efforts still in place and referral was made to medical examiner for further evaluation as we never determined a reason for such severe refractory pancreatitis.       Sandrea Hughs, MD Pulmonary and Critical Care Medicine [facility] Cell [phone]   After 7:00 pm call Elink  [phone]

## 2022-01-27 IMAGING — DX DG CHEST 1V PORT
1 series · 1 of 1 positions shown · non-contrast
Comparison: Yesterday

CLINICAL DATA: Acute pancreatitis with multi organ failure

EXAM:
PORTABLE CHEST 1 VIEW

[chest ap]
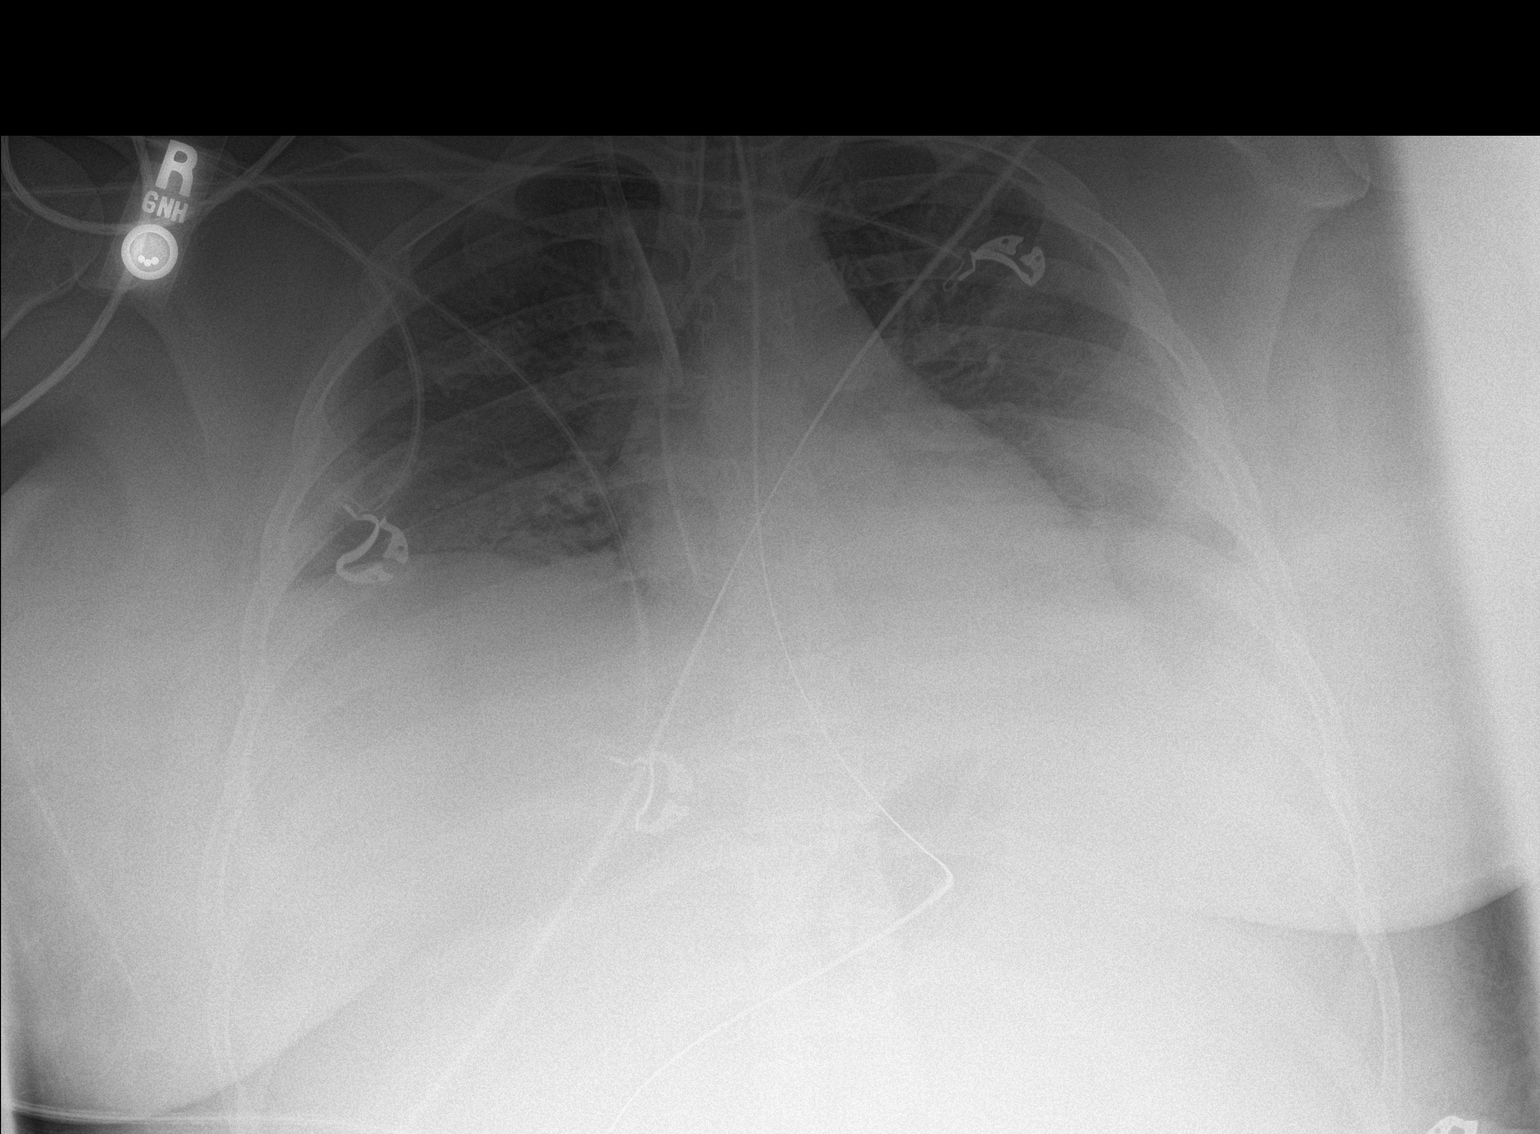

[1 of 1 positions shown; findings below may reference images not displayed]

FINDINGS: Endotracheal tube tip remains at the clavicular heads. Dialysis
catheter and central line with tips in unchanged position, central
line extending to the lower right atrium. The enteric tube reaches
the duodenum. Low volume chest with hazy opacity from airspace
disease or pleural fluid. No visible pneumothorax. Normal heart size
IMPRESSION: Stable hardware positioning and low volume chest with bilateral
airspace disease.
